# Patient Record
Sex: Female | Born: 1975 | Race: White | Hispanic: No | Marital: Married | Smoking: Never smoker
Health system: Southern US, Community
[De-identification: ages and names within clinical notes are randomized; demographics above are authoritative.]

## PROBLEM LIST (undated history)

## (undated) DIAGNOSIS — A6000 Herpesviral infection of urogenital system, unspecified: Secondary | ICD-10-CM

## (undated) DIAGNOSIS — IMO0002 Reserved for concepts with insufficient information to code with codable children: Secondary | ICD-10-CM

## (undated) DIAGNOSIS — R011 Cardiac murmur, unspecified: Secondary | ICD-10-CM

## (undated) DIAGNOSIS — Z8632 Personal history of gestational diabetes: Secondary | ICD-10-CM

## (undated) DIAGNOSIS — Z973 Presence of spectacles and contact lenses: Secondary | ICD-10-CM

## (undated) DIAGNOSIS — Z972 Presence of dental prosthetic device (complete) (partial): Secondary | ICD-10-CM

## (undated) HISTORY — DX: Herpesviral infection of urogenital system, unspecified: A60.00

## (undated) HISTORY — DX: Personal history of gestational diabetes: Z86.32

## (undated) HISTORY — PX: DILATION AND CURETTAGE OF UTERUS: SHX78

## (undated) HISTORY — DX: Reserved for concepts with insufficient information to code with codable children: IMO0002

---

## 1997-06-29 ENCOUNTER — Other Ambulatory Visit: Admission: RE | Admit: 1997-06-29 | Discharge: 1997-06-29 | Payer: Self-pay | Admitting: Internal Medicine

## 1999-09-25 ENCOUNTER — Other Ambulatory Visit: Admission: RE | Admit: 1999-09-25 | Discharge: 1999-09-25 | Payer: Self-pay | Admitting: Internal Medicine

## 2001-01-13 ENCOUNTER — Other Ambulatory Visit: Admission: RE | Admit: 2001-01-13 | Discharge: 2001-01-13 | Payer: Self-pay | Admitting: Gynecology

## 2002-03-02 DIAGNOSIS — Z8632 Personal history of gestational diabetes: Secondary | ICD-10-CM

## 2002-03-02 HISTORY — DX: Personal history of gestational diabetes: Z86.32

## 2002-09-06 ENCOUNTER — Encounter: Payer: Self-pay | Admitting: Gynecology

## 2002-09-06 ENCOUNTER — Ambulatory Visit (HOSPITAL_COMMUNITY): Admission: RE | Admit: 2002-09-06 | Discharge: 2002-09-06 | Payer: Self-pay | Admitting: Gynecology

## 2002-11-15 ENCOUNTER — Other Ambulatory Visit: Admission: RE | Admit: 2002-11-15 | Discharge: 2002-11-15 | Payer: Self-pay | Admitting: Gynecology

## 2003-04-03 ENCOUNTER — Encounter: Admission: RE | Admit: 2003-04-03 | Discharge: 2003-04-03 | Payer: Self-pay | Admitting: Gynecology

## 2003-05-05 ENCOUNTER — Inpatient Hospital Stay (HOSPITAL_COMMUNITY): Admission: AD | Admit: 2003-05-05 | Discharge: 2003-05-09 | Payer: Self-pay | Admitting: Gynecology

## 2003-05-22 ENCOUNTER — Inpatient Hospital Stay (HOSPITAL_COMMUNITY): Admission: AD | Admit: 2003-05-22 | Discharge: 2003-05-24 | Payer: Self-pay | Admitting: Gynecology

## 2003-05-22 ENCOUNTER — Encounter (INDEPENDENT_AMBULATORY_CARE_PROVIDER_SITE_OTHER): Payer: Self-pay | Admitting: Specialist

## 2003-07-17 ENCOUNTER — Other Ambulatory Visit: Admission: RE | Admit: 2003-07-17 | Discharge: 2003-07-17 | Payer: Self-pay | Admitting: Gynecology

## 2004-11-23 IMAGING — US US OB COMP +14 WK
1 series · 18 of 28 positions shown · non-contrast
Comparison: none

CLINICAL DATA: Cramping.  Please check cervical length.
 OBSTETRICAL ULTRASOUND
 Number of Fetuses: 1
 Heart Rate: 176 bpm
 Movement: Yes
 Breathing:  Yes  
 Presentation: Cephalic
 Placental Location: Fundal
 Grade: II
 Previa: No
 Amniotic Fluid (Subjective): Normal
 Amniotic Fluid (Objective):   15.3 cm AFI (6th-36th%ile = 8.1 to 24.8 cm for 34 weeks)

[Series 1: us ob comp +14 wk · 18 of 36 slices shown]
[im 1/36]
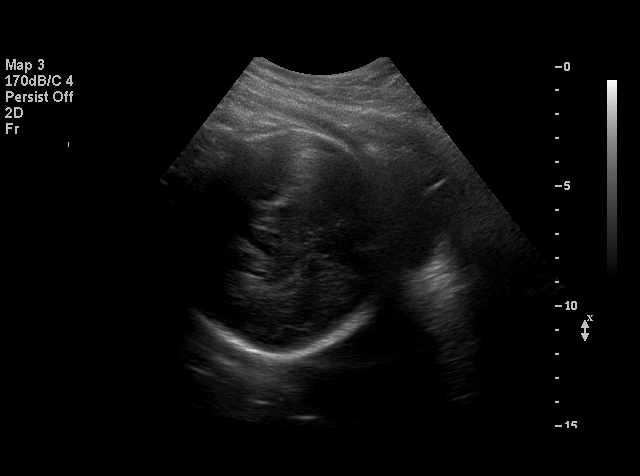
[im 3/36]
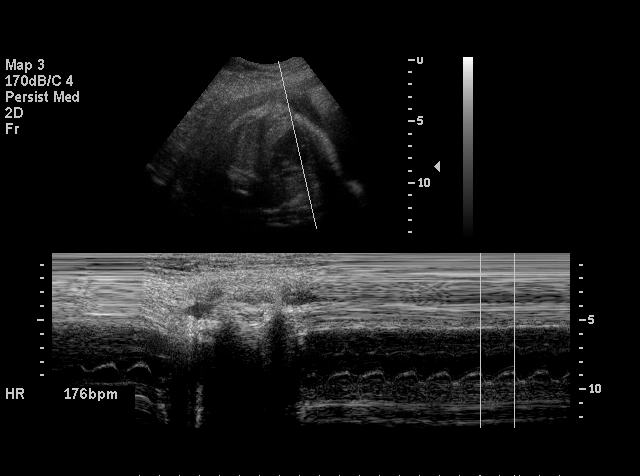
[im 4/36]
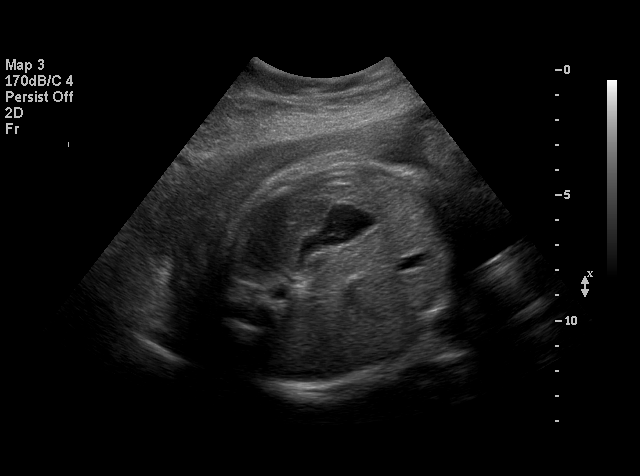
[im 7/36]
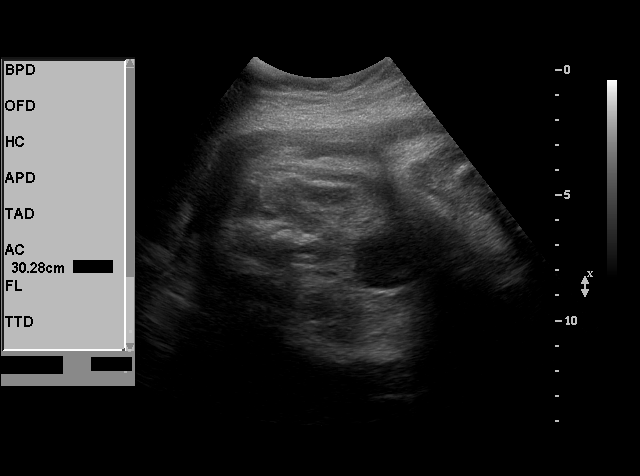
[im 10/36]
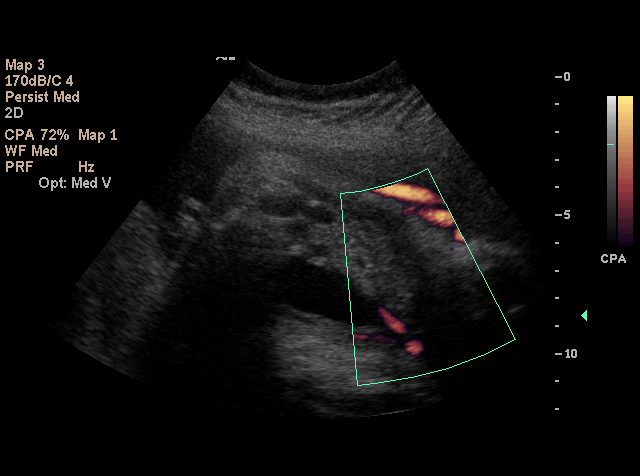
[im 11/36]
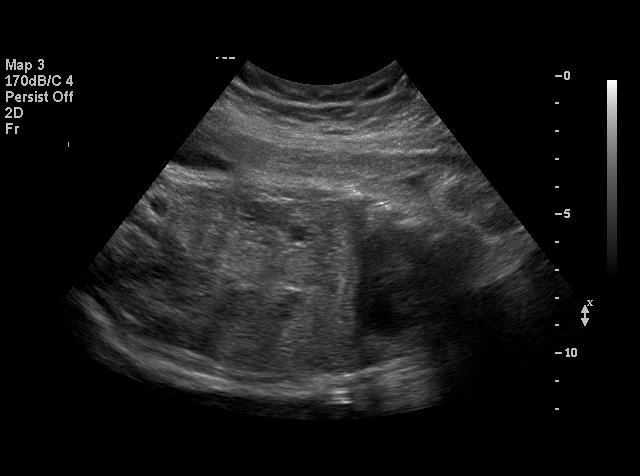
[im 13/36]
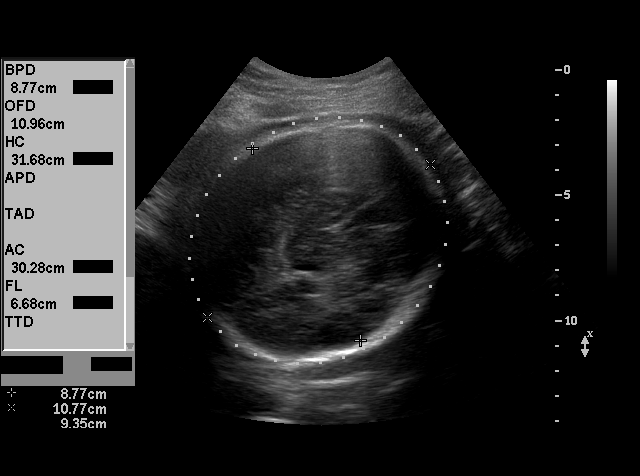
[im 15/36]
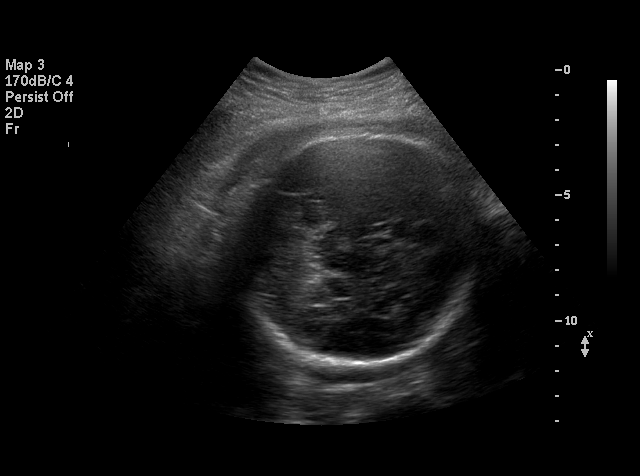
[im 17/36]
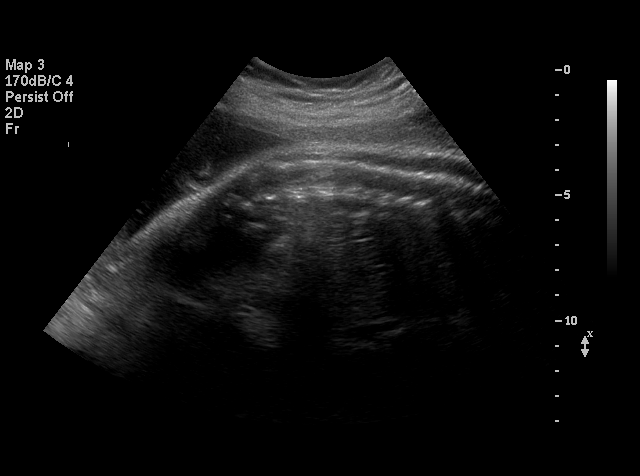
[im 19/36]
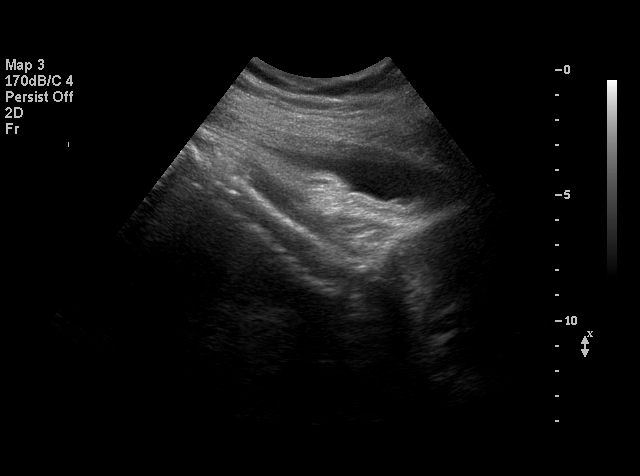
[im 21/36]
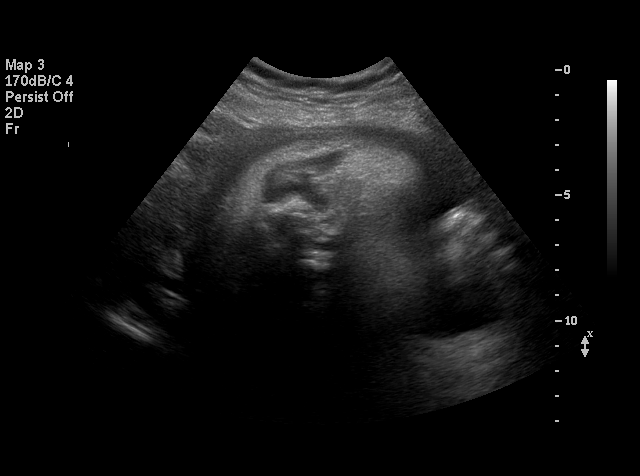
[im 23/36]
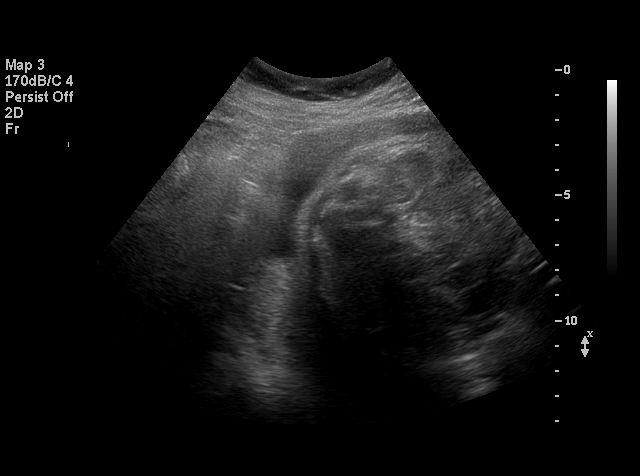
[im 25/36]
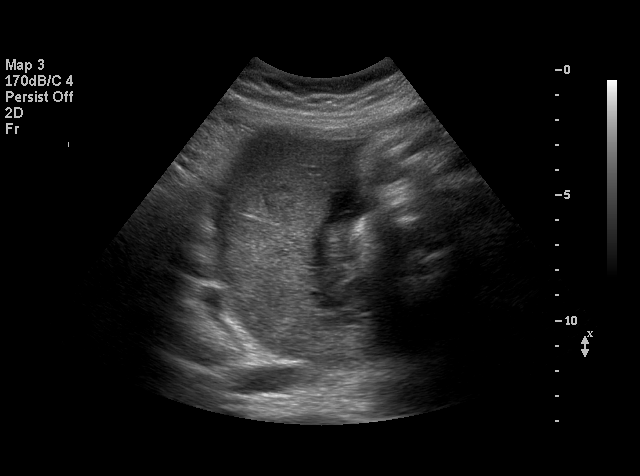
[im 28/36]
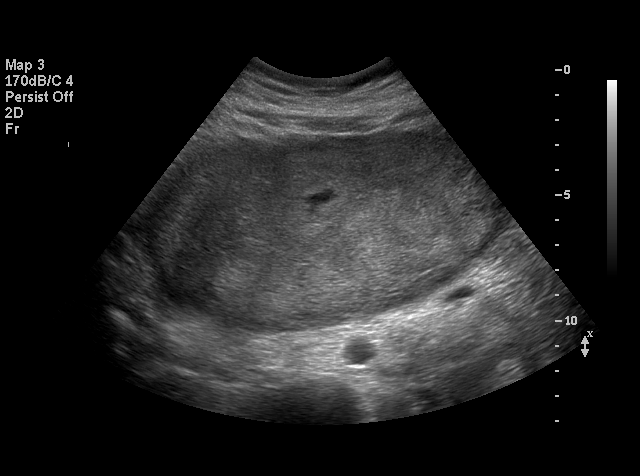
[im 29/36]
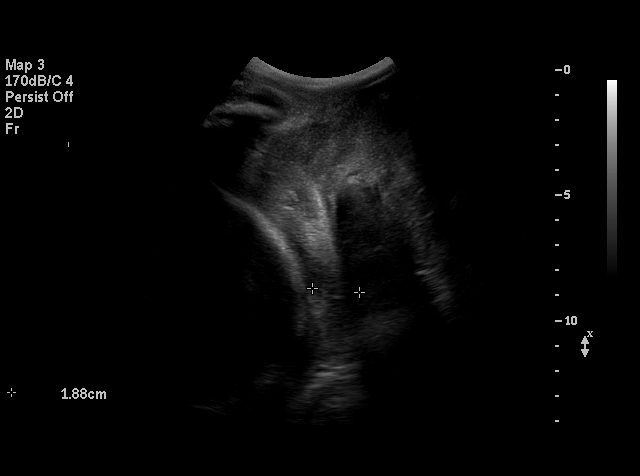
[im 32/36]
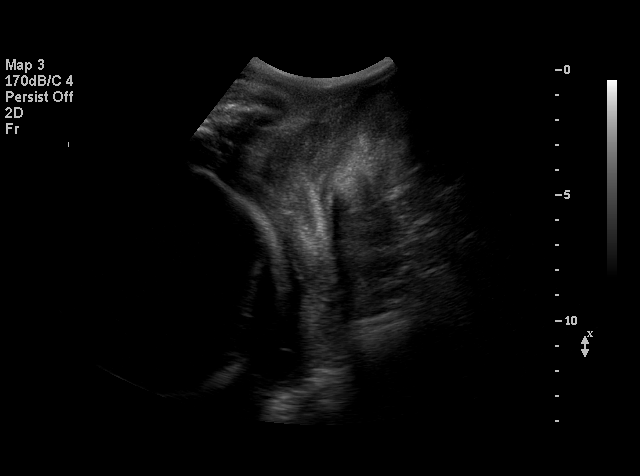
[im 33/36]
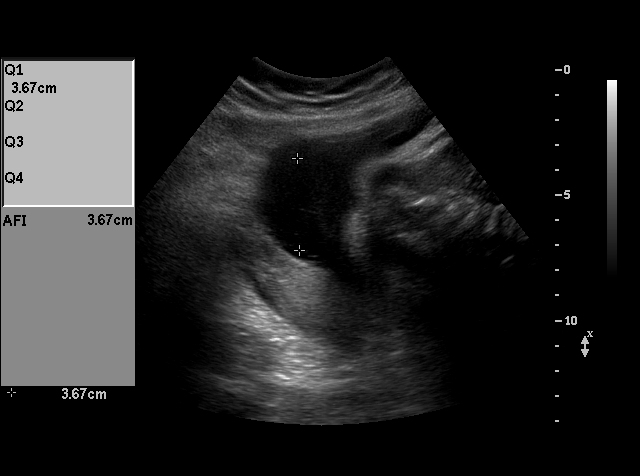
[im 36/36]
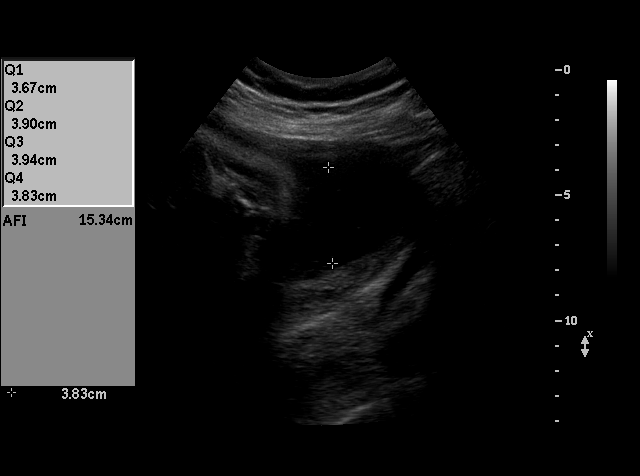

[18 of 28 positions shown; findings below may reference images not displayed]

FETAL BIOMETRY
 BPD:  9.0 cm, 36W 4D
 HC:  31.9 cm, 36W 0D   
 AC:  29.9 cm, 34W 0D  
 FL: 6.7 cm, 34W 4D

 MEAN GA: 35W 2D

 EFW: 4297 g (H) 04th-81th %ile (1996-3677) for 34 weeks 

 FETAL ANATOMY
 Lateral Ventricles: Visualized   
 Thalami/CSP: Visualized     
 Posterior Fossa:  Not visualized   
 Nuchal Region: N/A  
 Spine: Visualized     
 4 Chamber Heart on Left:   Visualized   
 Stomach on Left:   Visualized     
 3 Vessel Cord: Visualized   
 Cord Insertion site: Not visualized   
 Kidneys: Visualized   
 Bladder:   Visualized   
 Extremities: Not visualized     

 ADDITIONAL ANATOMY VISUALIZED: diaphragm.

 Evaluation limited by: fetal position and advanced gestational age.

 MATERNAL FINDINGS
 Cervix:   3.3 cm translabial.
IMPRESSION: Single living intrauterine gestation currently in a cephalic presentation with no evidence for placenta previa and Normal amniotic fluid volume.
 Gestational age by ultrasound concurs with assigned gestational age and estimated fetal weight from the 75th to 90th percentile.
 Cervical length 3.3 cm obtained translabially.
 Limited anatomical evaluation as noted above.

## 2004-11-26 IMAGING — US US OB LIMITED
1 series · 1 of 1 positions shown · non-contrast
Comparison: none

CLINICAL DATA: Assess cervical length.

[Series 1: unknown · 0.37mm/px · 1 of 1 slices shown]
[im 1/1]
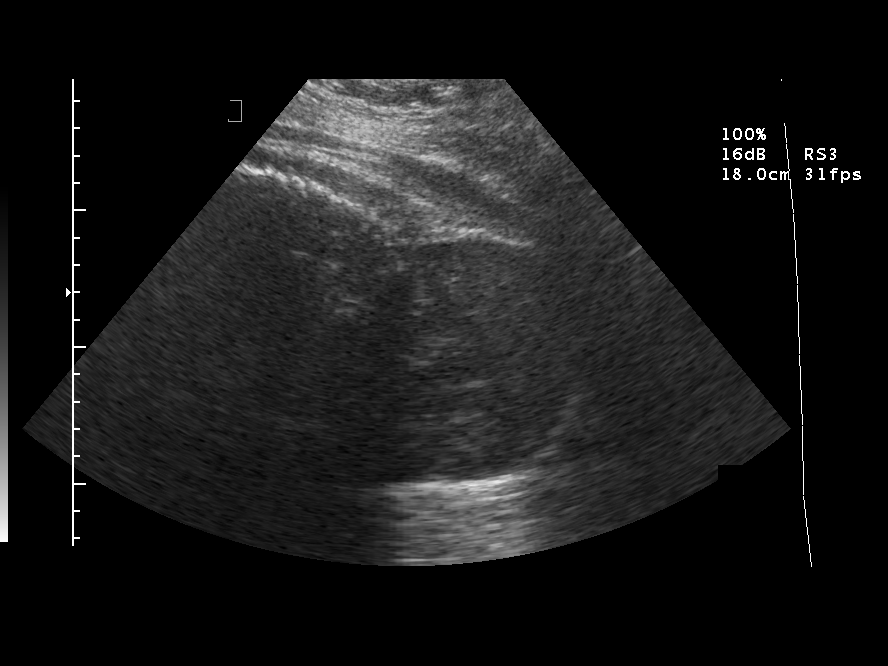

[1 of 1 positions shown; findings below may reference images not displayed]

LIMITED OBSTETRICAL ULTRASOUND WITH TRANSVAGINAL
Number of Fetuses:  1
Heart Rate:  150
Movement:  Yes
Breathing:  Yes
Presentation:  Cephalic
Placental Location:  Fundal
Grade:  I
Previa:  No
Amniotic Fluid (Subjective):  Normal
Amniotic Fluid (Objective):  17.9 cm AFI (5th - 95th%ile =   7.7 ? 24.9 cm for 35 wks)

Fetal measurements and complete anatomic evaluation were not requested.  The following fetal anatomy was visualized during this exam:  Lateral ventricles, stomach, kidneys, bladder and diaphragm.

MATERNAL FINDINGS
Cervix: 1.6 cm Transvaginally
IMPRESSION: Single living intrauterine fetus in cephalic presentation.    Cervical length is shortened to 1.6 cm on transvaginal scanning.  
Normal amniotic fluid volume with AFI 17.9 cm.

## 2005-02-18 ENCOUNTER — Other Ambulatory Visit: Admission: RE | Admit: 2005-02-18 | Discharge: 2005-02-18 | Payer: Self-pay | Admitting: Gynecology

## 2005-02-19 ENCOUNTER — Ambulatory Visit (HOSPITAL_COMMUNITY): Admission: RE | Admit: 2005-02-19 | Discharge: 2005-02-19 | Payer: Self-pay | Admitting: Gynecology

## 2005-02-19 ENCOUNTER — Encounter (INDEPENDENT_AMBULATORY_CARE_PROVIDER_SITE_OTHER): Payer: Self-pay | Admitting: Specialist

## 2005-08-05 ENCOUNTER — Other Ambulatory Visit: Admission: RE | Admit: 2005-08-05 | Discharge: 2005-08-05 | Payer: Self-pay | Admitting: Gynecology

## 2006-02-08 ENCOUNTER — Inpatient Hospital Stay (HOSPITAL_COMMUNITY): Admission: AD | Admit: 2006-02-08 | Discharge: 2006-02-11 | Payer: Self-pay | Admitting: Gynecology

## 2006-03-10 ENCOUNTER — Other Ambulatory Visit: Admission: RE | Admit: 2006-03-10 | Discharge: 2006-03-10 | Payer: Self-pay | Admitting: Gynecology

## 2006-12-16 ENCOUNTER — Other Ambulatory Visit: Admission: RE | Admit: 2006-12-16 | Discharge: 2006-12-16 | Payer: Self-pay | Admitting: Gynecology

## 2007-12-27 ENCOUNTER — Ambulatory Visit: Payer: Self-pay | Admitting: Gynecology

## 2007-12-27 ENCOUNTER — Other Ambulatory Visit: Admission: RE | Admit: 2007-12-27 | Discharge: 2007-12-27 | Payer: Self-pay | Admitting: Gynecology

## 2007-12-27 ENCOUNTER — Encounter: Payer: Self-pay | Admitting: Gynecology

## 2008-07-04 ENCOUNTER — Other Ambulatory Visit: Admission: RE | Admit: 2008-07-04 | Discharge: 2008-07-04 | Payer: Self-pay | Admitting: Gynecology

## 2008-07-04 ENCOUNTER — Ambulatory Visit: Payer: Self-pay | Admitting: Gynecology

## 2008-07-04 ENCOUNTER — Encounter: Payer: Self-pay | Admitting: Gynecology

## 2009-06-03 ENCOUNTER — Ambulatory Visit: Payer: Self-pay | Admitting: Gynecology

## 2009-06-03 ENCOUNTER — Other Ambulatory Visit: Admission: RE | Admit: 2009-06-03 | Discharge: 2009-06-03 | Payer: Self-pay | Admitting: Gynecology

## 2010-07-18 NOTE — Op Note (Signed)
Kathleen West, Kathleen West               ACCOUNT NO.:  [id number]   MEDICAL RECORD NO.:  [id number]          PATIENT TYPE:  AMB   LOCATION:  SDC                           [facility]ACILITY:  [facility]   PHYSICIAN:  Ivor Costa. Farrel Gobble, M.D. DATE OF BIRTH:  [birth year]   DATE OF PROCEDURE:  02/19/2005  DATE OF DISCHARGE:                                 OPERATIVE REPORT   PREOPERATIVE DIAGNOSIS:  Missed abortion at nine weeks.   POSTOPERATIVE DIAGNOSIS:  Missed abortion at nine weeks.   OPERATION/PROCEDURE:  First trimester dilation and evacuation.   SURGEON:  Ivor Costa. Farrel Gobble, M.D.   ANESTHESIA:  MAC with paracervical block.   ESTIMATED BLOOD LOSS:  Minimal.   FINDINGS:  The patient uterus is anteverted, mobile with smooth contours.   PATHOLOGY:  Uterine contents.   DESCRIPTION OF PROCEDURE:  The patient was taken to the operating room.  MAC  anesthesia was induced.  She was placed in the dorsal lithotomy position.  Prepped and draped in the usual sterile fashion.  Bivalve speculum was  placed in the vagina and cervix was visualized and stabilized with a single-  tooth tenaculum and paracervical block of equal parts 2% lidocaine and 0.25%  Marcaine was placed circumferentially around the cervix.  The cervix was  then gently dilated up to 31-French after which a #9 disposable trocar was  inserted through the cervix.  The opening pressure was obtained and the  cavity was cleared of its contents.  A second pass failed to reveal any  tissue. A gentle curetting showed normal crie, smooth contours and no  contents left in the cavity.  The patient tolerated the procedure well.  She  received Methergine intraoperatively and transferred to the PACU in stable  condition.      Ivor Costa. Farrel Gobble, M.D.  Electronically Signed     THL/MEDQ  D:  02/19/2005  T:  02/20/2005  Job:  [id number]

## 2010-07-18 NOTE — H&P (Signed)
NAMEGAGANDEEP, Kathleen West               ACCOUNT NO.:  [id number]   MEDICAL RECORD NO.:  [id number]          PATIENT TYPE:  INP   LOCATION:  9164                          [facility]ILITY:  [facility]   PHYSICIAN:  Timothy P. Fontaine, M.D.DATE OF BIRTH:  01-27-1976   DATE OF ADMISSION:  02/08/2006  DATE OF DISCHARGE:                              HISTORY & PHYSICAL   skip>   CHIEF COMPLAINT:  Labor.   HISTORY OF PRESENT ILLNESS:  A 35 year old G49, P32 female at [redacted] weeks  gestation who enters contracting every 5 minutes.  Admission exam in  triage was approximately 5 cm with a reactive fetal tracing and she was  admitted for labor and delivery.  The patient's past obstetrical history  is significant for preterm delivery at 36 weeks.  The patient's  pregnancy has otherwise been uncomplicated.  For remainder of her  history, see her [place].  She is beta strep negative carrier.  She  has been monitoring her glucose at home due to a past history of  diabetes and these have been normal.  She has had normal antepartum  testing to include nonstress tests.   PHYSICAL EXAMINATION TODAY:  HEENT: Normal.  LUNGS: Clear.  CARDIAC:  Regular rate.  No rubs, murmurs or gallops.  ABDOMEN:  Gravid, vertex fetus consistent with dates.  EXTERNAL MONITORS:  Reactive fetal tracing, contractions every 2-5  minutes.  PELVIC:  Exam is 90%, 5 cm, 0 station, bulging membranes.  Artificial  rupture of membranes, fluid noted to be clear.   ASSESSMENT AND PLAN:  A 35 year old gravida 3, para 1 female, 37 weeks,  labor, membranes ruptured, fluid clear.  Progress with labor and  delivery.  Beta strep is negative.      Timothy P. Fontaine, M.D.  Electronically Signed     TPF/MEDQ  D:  02/09/2006  T:  02/09/2006  Job:  [id number]

## 2010-07-18 NOTE — H&P (Signed)
NAME:  Kathleen West, Kathleen West                         ACCOUNT NO.:  [id number]   MEDICAL RECORD NO.:  [id number]                   PATIENT TYPE:  INP   LOCATION:  9153                                 [facility]ILITY:  [facility]   PHYSICIAN:  Juan H. Lily Peer, M.D.             DATE OF BIRTH:  [birth year]   DATE OF ADMISSION:  05/05/2003  DATE OF DISCHARGE:                                HISTORY & PHYSICAL   CHIEF COMPLAINT:  Abdominal cramping.   HISTORY OF PRESENT ILLNESS:  Kathleen West is a 35 year old gravida 1, para 0,  who conceived via Clomid.  Based on early ultrasound estimated date of  confinement has been calculated to be April 15 and she is currently 70  weeks' gestation.  The patient had phoned earlier and presented to [facility] as per instruction due to the fact that she had been experiencing  vague abdominal cramping that was steady in nature which started today.  On  further questioning she stated for the past two weeks she has noticed an  increased cervical mucus and discharge, but no blood-like material noted,  and the cramping has been on and off for the past two weeks, but brought her  attention that it was more of a constant nature today.  She was examined and  found to be 2-3 cm dilated, about 80% effaced, intact, and -3 station.  She  was placed on the monitor and initially appeared to be uterine irritability  and during the time of the physical examination she was noted to have  contractions, mild, every 2-3 minutes apart, but an overall reassuring fetal  heart rate tracing.  She was afebrile with temperature 98.2 and blood  pressure of 127/71.  She was sent for ultrasound for a cervical lymph  measurement and a full ultrasound scan.  Her cervix measured 3.3 cm and  estimated fetal weight was in the 75th to 90th percentile for 34 weeks'  gestation.  Vertex presentation AFI was normal and the uterus was a grade 2  in fundal location.  Estimated fetal weight was recorded 2416  to 2687 g.   PHYSICAL EXAMINATION:  VITAL SIGNS:  Her temperature was 98.2, blood  pressure 127/71.  HEENT:  Unremarkable.  NECK:  Supple.  Trachea midline.  No current bruits.  No thyromegaly.  LUNGS:  Clear to auscultation without any rhonchi or wheezes.  HEART:  Regular rate and rhythm.  No murmurs or gallops.  BREASTS:  Exam not done.  ABDOMEN:  Gravid uterus.  Vertex presentation by [facility] maneuver.  PELVIC:  Exam as described above.  EXTREMITIES:  Trace edema.  DTR 1+, __________clonus.   LABORATORY DATA:  Serum electrolytes were normal.  Glucose 84.  Patient with  gestational diabetes on insulin consisting of four units of NPH at bedtime.  She said that her fasting blood sugar was less than 90 this mon ring before  coming to the hospital.  Her CBC demonstrated a white blood count of 11.4,  hemoglobin and hematocrit of 11.6 and 34.7 respectively, with a platelet  count of 260,000.  Her urinalysis had a specific gravity of 1.010 and pH of  7.5 and there was large amount of leukocyte esterase and trace hemoglobin.  Her wet prep demonstrated yeast and moderate amount of wbc's and bacteria  too numerous to count.  GC and Chlamydia culture were obtained as well as  group B Strep pending at time of this dictation.   ASSESSMENT:  A 35 year old gravida 1, para 0, 34 weeks estimated gestational  age, with premature cervical dilatation in apparent preterm labor.  Will  proceed with a fetal fibronectin tomorrow after 24 hours.  This pelvic exam  was just recently done.  GC and Chlamydia culture pending at time of this  dictation.  She will be admitted and first lactated with lactated ringers  500 bolus followed by 125 cc per hour, and if this does not break the  pattern of her premature contraction, will initiate  tocolysis such as with  magnesium sulfate.  Since the GBS status is unknown to me, bacteria seen on  wet prep, will go ahead and start with Pen G 5 milliunits IV now followed  by  2.5 milliunits IV q.4h. and continue with monitoring and bed rest.  Will  continue to monitor her blood sugars and fasting blood sugars and two hour  postprandial and continue her NPH insulin consisting of four units of NPH  q.h.s.  All the above findings and management discussed with the patient and  husband who was present.   PLAN:  As per assessment above.                                               Juan H. Lily Peer, M.D.    JHF/MEDQ  D:  05/05/2003  T:  05/05/2003  Job:  [id number]

## 2010-07-18 NOTE — Discharge Summary (Signed)
NAME:  Kathleen West, Kathleen West                         ACCOUNT NO.:  [id number]   MEDICAL RECORD NO.:  [id number]                   PATIENT TYPE:  INP   LOCATION:  9153                                 [facility]   PHYSICIAN:  Ivor Costa. Farrel Gobble, M.D.              DATE OF BIRTH:  11-14-1975   DATE OF ADMISSION:  05/05/2003  DATE OF DISCHARGE:  05/09/2003                                 DISCHARGE SUMMARY   DISCHARGE DIAGNOSIS:  1. Intrauterine pregnancy at 34 weeks, undelivered.  2. Preterm labor.  3. Gestational diabetes, insulin dependent.   HISTORY:  A 27-years-of-age female gravida 1 para 0 with EDC of June 15, 2003.  Prenatal course had been uncomplicated to admission except for  gestational diabetes which was insulin dependent.   HOSPITAL COURSE:  On May 05, 2003 the patient presented at 34 weeks with  abdominal cramping.  She had noticed increased amount of vaginal discharge 2  weeks prior.  Examined and found to be 2-3 cm, 80%, -3.  The patient was  monitored, found to be contracting, and was diagnosed with preterm labor.  Was begun on IV Penicillin G.  GC and chlamydia were obtained as well as wet  prep.  All returned within normal limits and the patient was placed on  bedrest.  She was continued on NPH insulin.  On May 06, 2003 the patient  had been given betamethasone and was doing well.  She was on magnesium  sulfate.  On May 07, 2003 the patient was weaned off of magnesium sulfate  and started on Procardia 10 mg p.o. q.6h.  On May 08, 2003 no regular  contractions.  A.m. sonogram for cervical length of 1.6 and cervical exam  was 2-3 and 80% which is stable.  Group B strep had returned negative and on  May 08, 2003 the patient had increased cramping and nifedipine had been  increased to 20 mg q.6h. which decreased the symptoms.  Cervix, however,  remained stable without vaginal changes and on May 09, 2003 the patient was  without complaint, felt only occasional  contraction, tolerating Procardia  well.  She was 2-3, 90%, -3, and posterior.  Nonstress test was reactive  without decelerations, contractions approximately every 9 minutes.  At that  point the patient was assessed with no vaginal exam changes despite  contractions and thought was stable for discharge to home.   ACCESSORY CLINICAL FINDINGS/LABORATORY DATA:  The patient is group B strep  negative.  GC and chlamydia returned negative on May 05, 2003.  Hemoglobin  on May 05, 2003 was 11.6.   DISPOSITION:  The patient discharged to home.  She was to return biweekly  for office visits and cervical checks, was given labor precautions with  continued Procardia 20 mg q.i.d. until 37 weeks, and was placed on bedrest.     Susa Loffler, P.A.  Ivor Costa. Farrel Gobble, M.D.    TSG/MEDQ  D:  06/11/2003  T:  06/11/2003  Job:  [id number]

## 2010-07-18 NOTE — Discharge Summary (Signed)
NAME:  Kathleen West, Kathleen West                         ACCOUNT NO.:  [id number]   MEDICAL RECORD NO.:  [id number]                   PATIENT TYPE:  INP   LOCATION:  9133                                 [facility]   PHYSICIAN:  Ivor Costa. Farrel Gobble, M.D.              DATE OF BIRTH:  March 14, 1975   DATE OF ADMISSION:  05/22/2003  DATE OF DISCHARGE:  05/24/2003                                 DISCHARGE SUMMARY   DISCHARGE DIAGNOSES:  1. Intrauterine pregnancy at 36+ weeks delivered.  2. History of preterm labor.  3. Gestational diabetes, insulin dependent.  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  5. Status post spontaneous vaginal delivery.   HISTORY:  This is a 35 year old female gravida 1 para 0 with an EDC of June 15, 2003.  Prenatal course had been complicated by the patient had a history  of ocular HSV.  She also had a questionable history of chronic hypertension  and developed gestational diabetes which was insulin dependent, developed  preterm labor in pregnancy, and actually 2 weeks prior to admission had been  tocolyzed and given steroids and discontinued with Procardia.   HOSPITAL COURSE:  On May 22, 2003 the patient presented at 36 weeks with  contractions every 5 minutes.  Cervix was 4, 90%, and 0 station.  Options  were discussed with the patient.  The patient desired artificial rupture of  membranes and therefore subsequently underwent the same and revealed clear  fluid.  The patient was begun on IV penicillin for group B strep protocol  and subsequently on May 22, 2003 the patient underwent a spontaneous  vaginal delivery of a female, Apgars of 8 and 9, weight of 7 pounds 1 ounce.  There was no episiotomy.  There was a second degree midline vaginal  laceration which was repaired.  There was a right left labia minora inner  and periurethral tear which was repaired.  There were no complications.  Postpartum the patient remained afebrile, voiding, in stable condition and  she  was discharged to home on May 24, 2003 and given [facility] Gynecology  postpartum instructions and postpartum booklet.   ACCESSORY CLINICAL FINDINGS/LABORATORY DATA:  The patient is O positive,  rubella immune.  On May 23, 2003 hemoglobin 11.2.   DISPOSITION:  The patient was discharged to home, informed to return to the  office in 6 weeks, if had any problem prior to that time to be seen in the  office.  She is to check blood sugars prior to 6 weeks postpartum check.     Susa Loffler, P.A.                    Ivor Costa. Farrel Gobble, M.D.    TSG/MEDQ  D:  06/11/2003  T:  06/11/2003  Job:  [id number]

## 2010-10-17 ENCOUNTER — Encounter: Payer: Self-pay | Admitting: *Deleted

## 2010-10-17 DIAGNOSIS — Z8632 Personal history of gestational diabetes: Secondary | ICD-10-CM | POA: Insufficient documentation

## 2010-10-20 ENCOUNTER — Other Ambulatory Visit (HOSPITAL_COMMUNITY)
Admission: RE | Admit: 2010-10-20 | Discharge: 2010-10-20 | Disposition: A | Discharge status: Home / Self Care | Payer: 59 | Source: Ambulatory Visit | Attending: Gynecology | Admitting: Gynecology

## 2010-10-20 ENCOUNTER — Encounter: Payer: Self-pay | Admitting: Gynecology

## 2010-10-20 ENCOUNTER — Ambulatory Visit (INDEPENDENT_AMBULATORY_CARE_PROVIDER_SITE_OTHER): Payer: 59 | Admitting: Gynecology

## 2010-10-20 VITALS — BP 116/74 | Ht 66.0 in | Wt 178.0 lb

## 2010-10-20 DIAGNOSIS — Z01419 Encounter for gynecological examination (general) (routine) without abnormal findings: Secondary | ICD-10-CM

## 2010-10-20 DIAGNOSIS — Z131 Encounter for screening for diabetes mellitus: Secondary | ICD-10-CM

## 2010-10-20 DIAGNOSIS — R82998 Other abnormal findings in urine: Secondary | ICD-10-CM

## 2010-10-20 DIAGNOSIS — Z8632 Personal history of gestational diabetes: Secondary | ICD-10-CM

## 2010-10-20 DIAGNOSIS — Z13220 Encounter for screening for lipoid disorders: Secondary | ICD-10-CM

## 2010-10-20 NOTE — Progress Notes (Signed)
Kathleen West [birth year] [id number]        35 y.o.  for annual exam.  Mirena IUD contraception due to be replaced this coming January.  Past medical history,surgical history, allergies, family history and social history were all reviewed and documented in the EPIC chart. ROS:  Was performed and pertinent positives and negatives are included in the history.  Exam: chaperone present Filed Vitals:   10/20/10 1155  BP: 116/74   General appearance  Normal Skin grossly normal Head/Neck normal with no cervical or supraclavicular adenopathy thyroid normal Lungs  clear Cardiac RR, without RMG Abdominal  soft, nontender, without masses, organomegaly or hernia Breasts  examined lying and sitting without masses, retractions, discharge or axillary adenopathy. Pelvic  Ext/BUS/vagina  normal   Cervix  normal  Pap done IUD string visualized  Uterus  anteverted, normal size, shape and contour, midline and mobile nontender   Adnexa  Without masses or tenderness    Anus and perineum  normal   Rectovaginal  normal sphincter tone without palpated masses or tenderness.    Assessment/Plan:  35 y.o. female for annual exam.   Doing well. Reminded her to schedule for IUD replacement this coming December or January she knows to do so. Self breast exams on the basis discussed urged. Screening mammogram recommendations  between 35 and 40 were reviewed, patient has no strong family history prefers to wait closer to 40.  Will check baseline CBC glucose lipid profile and urinalysis.    Dara Lords MD, 12:27 PM 10/20/2010

## 2011-03-05 ENCOUNTER — Telehealth: Payer: Self-pay | Admitting: *Deleted

## 2011-03-05 ENCOUNTER — Other Ambulatory Visit: Payer: Self-pay | Admitting: *Deleted

## 2011-03-05 DIAGNOSIS — Z3049 Encounter for surveillance of other contraceptives: Secondary | ICD-10-CM

## 2011-03-05 MED ORDER — LEVONORGESTREL 20 MCG/24HR IU IUD: INTRAUTERINE_SYSTEM | Freq: Once | INTRAUTERINE | Status: DC

## 2011-03-05 NOTE — Telephone Encounter (Signed)
Patient informed Mirena IUD benefits are covered at 100%.  Has upcoming appt to remove and insert.

## 2011-03-20 ENCOUNTER — Ambulatory Visit: Payer: 59 | Admitting: Gynecology

## 2011-03-27 ENCOUNTER — Ambulatory Visit: Payer: 59 | Admitting: Gynecology

## 2011-05-22 ENCOUNTER — Encounter: Payer: Self-pay | Admitting: Gynecology

## 2011-05-22 ENCOUNTER — Ambulatory Visit (INDEPENDENT_AMBULATORY_CARE_PROVIDER_SITE_OTHER): Payer: 59 | Admitting: Gynecology

## 2011-05-22 DIAGNOSIS — Z30433 Encounter for removal and reinsertion of intrauterine contraceptive device: Secondary | ICD-10-CM

## 2011-05-22 NOTE — Progress Notes (Signed)
Patient presents to have her Mirena IUD replaced. She's doing well and wants to continue with this is at her 5 year mark. I reviewed the removal and reinsertion process with her. She is ready to her a booklet and signed our consent form. Reviewed the risks to include infection, perforation and migration requiring surgery to remove, failure with pregnancy all of which she understands and accepts.  Exam was Sherrilyn Rist chaperone present External BUS vagina normal. Cervix normal with IUD string visualized. Uterus anteverted normal size midline mobile nontender. Adnexa without masses or tenderness  Procedure Cervix was visualized, IUD string grasped and the Mirena IUD was removed shown to the patient and discarded. The cervix was cleansed with Betadine, single-tooth tenaculum anterior lip stabilization, the uterus was sounded and a new Mirena was placed according to manufacturer's recommendations. The strings were trimmed. The patient tolerated well and will follow up in one month for a postinsertional check.

## 2011-05-22 NOTE — Patient Instructions (Signed)
Follow up in one month for postinsertional check

## 2011-05-25 ENCOUNTER — Other Ambulatory Visit: Payer: Self-pay | Admitting: Gynecology

## 2011-05-25 DIAGNOSIS — Z3049 Encounter for surveillance of other contraceptives: Secondary | ICD-10-CM

## 2011-10-19 ENCOUNTER — Ambulatory Visit (INDEPENDENT_AMBULATORY_CARE_PROVIDER_SITE_OTHER): Payer: 59 | Admitting: Gynecology

## 2011-10-19 ENCOUNTER — Encounter: Payer: Self-pay | Admitting: Gynecology

## 2011-10-19 DIAGNOSIS — T839XXA Unspecified complication of genitourinary prosthetic device, implant and graft, initial encounter: Secondary | ICD-10-CM

## 2011-10-19 DIAGNOSIS — N926 Irregular menstruation, unspecified: Secondary | ICD-10-CM

## 2011-10-19 LAB — TSH: TSH: 2.069 u[IU]/mL (ref 0.350–4.500)

## 2011-10-19 MED ORDER — NORETHIN ACE-ETH ESTRAD-FE 1-20 MG-MCG PO TABS: 1.0000 | ORAL_TABLET | Freq: Every day | ORAL | Status: DC

## 2011-10-19 NOTE — Patient Instructions (Signed)
Start on oral contraceptives as we discussed. Follow for your annual exam in several months. Sooner if any issues.

## 2011-10-19 NOTE — Progress Notes (Signed)
Patient presents to discuss symptoms she's having with IUD. She had her Mirena IUD replaced in March and was doing well before this. She now is having light bleeding every month which she did not have before and notes some extra o hair growthn her face and some mild acne.  Exam was can assistant Abdomen soft nontender without masses guarding rebound organomegaly. Pelvic external BUS vagina normal. Cervix normal IUD string visualized an appropriate length. Uterus anteverted normal size midline mobile nontender. Adnexa without masses or tenderness.  Assessment and plan: Mild IUD side effects. Options for management were reviewed. Patient initially wanted her IUD pulled but on further discussion we both agree on starting a low-dose oral contraceptive for 3-6 months then stopping it and see how she does with the IUD in place. She does not smoke and is not being followed for any other medical issues. If she does well then she will continue with the IUD. If not then we'll ultimately remove it and continue on the low-dose oral contraceptives. Stroke heart attack DVT reviewed. She is due for her annual exam now have and I asked her to schedule in several months and that'll let us see how she's doing at that point on the birth control pills. I did recommend a baseline TSH given her symptoms and to go ahead and have that drawn today.

## 2011-11-12 ENCOUNTER — Telehealth: Payer: Self-pay | Admitting: *Deleted

## 2011-11-12 NOTE — Telephone Encounter (Signed)
Pt informed with normal Tsh results.

## 2011-12-15 ENCOUNTER — Other Ambulatory Visit: Payer: Self-pay | Admitting: Women's Health

## 2012-03-21 ENCOUNTER — Encounter: Payer: 59 | Admitting: Gynecology

## 2012-03-25 ENCOUNTER — Encounter: Payer: Self-pay | Admitting: Gynecology

## 2012-03-25 ENCOUNTER — Ambulatory Visit (INDEPENDENT_AMBULATORY_CARE_PROVIDER_SITE_OTHER): Payer: 59 | Admitting: Gynecology

## 2012-03-25 VITALS — BP 120/76 | Ht 65.5 in | Wt 179.0 lb

## 2012-03-25 DIAGNOSIS — Z30432 Encounter for removal of intrauterine contraceptive device: Secondary | ICD-10-CM

## 2012-03-25 DIAGNOSIS — Z13220 Encounter for screening for lipoid disorders: Secondary | ICD-10-CM

## 2012-03-25 DIAGNOSIS — Z01419 Encounter for gynecological examination (general) (routine) without abnormal findings: Secondary | ICD-10-CM

## 2012-03-25 DIAGNOSIS — T839XXA Unspecified complication of genitourinary prosthetic device, implant and graft, initial encounter: Secondary | ICD-10-CM

## 2012-03-25 LAB — CBC WITH DIFFERENTIAL/PLATELET
Lymphocytes Relative: 28 % (ref 12–46)
Lymphs Abs: 2.5 10*3/uL (ref 0.7–4.0)
MCH: 29.3 pg (ref 26.0–34.0)
MCHC: 33.2 g/dL (ref 30.0–36.0)
MCV: 88.3 fL (ref 78.0–100.0)
Monocytes Relative: 10 % (ref 3–12)

## 2012-03-25 MED ORDER — NORETHIN ACE-ETH ESTRAD-FE 1-20 MG-MCG PO TABS: 1.0000 | ORAL_TABLET | Freq: Every day | ORAL | Status: DC

## 2012-03-25 NOTE — Progress Notes (Signed)
Kathleen West [birth year] [id number]        37 y.o.  [id number] for annual exam.  Still is having issues with acne and vulvar boils attributed to her IUD. She wants to go and have this removed.  She has seen dermatology and is doing some treatments through them but she is tired of dealing with this and just wants to have the IUD removed and plans on using oral contraceptives.  Past medical history,surgical history, medications, allergies, family history and social history were all reviewed and documented in the EPIC chart. ROS:  Was performed and pertinent positives and negatives are included in the history.  Exam: [name] assistant Filed Vitals:   03/25/12 1030  BP: 120/76  Height: 5' 5.5" (1.664 m)  Weight: 179 lb (81.194 kg)   General appearance  Normal Skin grossly normal Head/Neck normal with no cervical or supraclavicular adenopathy thyroid normal Lungs  clear Cardiac RR, without RMG Abdominal  soft, nontender, without masses, organomegaly or hernia Breasts  examined lying and sitting without masses, retractions, discharge or axillary adenopathy. Pelvic  Ext/BUS/vagina  normal   Cervix  normal IUD string visualized, grasped with a [place] forcep and her Mirena IUD was removed, shown to her and discarded  Uterus  anteverted, normal size, shape and contour, midline and mobile nontender   Adnexa  Without masses or tenderness    Anus and perineum  normal   Rectovaginal  normal sphincter tone without palpated masses or tenderness.    Assessment/Plan:  37 y.o. [id number] female for annual exam.   1. Mirena IUD complication. Patient having androgenic type side effects from the IUD which circumstantially was related to her replacement. She wants to go ahead and have her IUD removed and I went ahead and removed it today. We'll start on Loestrin 120 equivalent BCPs. I had prescribed these previously but she never started on the Zetia go ahead and do this now. Back up contraception first  pack. 2. Pap smear 10/2010.  No Pap smear done today. History of low-grade SIL 2008 with negative annual Pap smears since then. We'll plan every 3 year to 5 year screening. 3. Mammography. Reviewed screening mammographic recommendations between 35 and 40. She has a strong family history firstly closer to 40. SBE monthly reviewed. 4. Health maintenance. Baseline CBC comprehensive metabolic panel lipid profile and urinalysis ordered. Follow up one year, sooner as needed.    Dara Lords MD, 10:56 AM 03/25/2012

## 2012-03-25 NOTE — Patient Instructions (Signed)
Start on oral contraceptives as discussed. Use condom backup first pack. Follow up in one year for annual exam, sooner if any issues.

## 2012-03-25 NOTE — Addendum Note (Signed)
Addended by: Dayna Barker on: 03/25/2012 11:06 AM   Modules accepted: Orders

## 2012-03-26 LAB — COMPREHENSIVE METABOLIC PANEL
AST: 14 U/L (ref 0–37)
Chloride: 103 mEq/L (ref 96–112)
Glucose, Bld: 80 mg/dL (ref 70–99)
Sodium: 136 mEq/L (ref 135–145)
Total Bilirubin: 0.6 mg/dL (ref 0.3–1.2)
Total Protein: 6.6 g/dL (ref 6.0–8.3)

## 2012-03-26 LAB — URINALYSIS W MICROSCOPIC + REFLEX CULTURE
Bacteria, UA: NONE SEEN
Crystals: NONE SEEN
Glucose, UA: NEGATIVE mg/dL
Nitrite: NEGATIVE
Protein, ur: NEGATIVE mg/dL
Specific Gravity, Urine: 1.025 (ref 1.005–1.030)
pH: 6.5 (ref 5.0–8.0)

## 2012-03-26 LAB — LIPID PANEL
Cholesterol: 164 mg/dL (ref 0–200)
VLDL: 24 mg/dL (ref 0–40)

## 2012-04-16 ENCOUNTER — Other Ambulatory Visit: Payer: Self-pay

## 2013-01-05 ENCOUNTER — Other Ambulatory Visit: Payer: Self-pay

## 2013-09-08 ENCOUNTER — Encounter (INDEPENDENT_AMBULATORY_CARE_PROVIDER_SITE_OTHER): Payer: Self-pay | Admitting: General Surgery

## 2013-09-22 ENCOUNTER — Ambulatory Visit (INDEPENDENT_AMBULATORY_CARE_PROVIDER_SITE_OTHER): Payer: Self-pay | Admitting: General Surgery

## 2013-09-27 ENCOUNTER — Ambulatory Visit (INDEPENDENT_AMBULATORY_CARE_PROVIDER_SITE_OTHER): Payer: 59 | Admitting: General Surgery

## 2013-10-25 ENCOUNTER — Encounter (INDEPENDENT_AMBULATORY_CARE_PROVIDER_SITE_OTHER): Payer: Self-pay | Admitting: General Surgery

## 2013-10-25 ENCOUNTER — Ambulatory Visit (INDEPENDENT_AMBULATORY_CARE_PROVIDER_SITE_OTHER): Payer: 59 | Admitting: General Surgery

## 2013-10-25 VITALS — BP 126/80 | HR 77 | Temp 97.5°F | Ht 65.0 in | Wt 183.0 lb

## 2013-10-25 DIAGNOSIS — K602 Anal fissure, unspecified: Secondary | ICD-10-CM

## 2013-10-25 MED ORDER — AMBULATORY NON FORMULARY MEDICATION: Status: DC

## 2013-10-25 NOTE — Patient Instructions (Signed)
Anal Fissure, Adult An anal fissure is a small tear or crack in the skin around the anus. Bleeding from a fissure usually stops on its own within a few minutes. However, bleeding will often reoccur with each bowel movement until the crack heals.  CAUSES   Passing large, hard stools.  Frequent diarrheal stools.  Constipation.  Inflammatory bowel disease (Crohn's disease or ulcerative colitis).  Infections.  Anal sex. SYMPTOMS   Small amounts of blood seen on your stools, on toilet paper, or in the toilet after a bowel movement.  Rectal bleeding.  Painful bowel movements.  Itching or irritation around the anus. DIAGNOSIS Your caregiver will examine the anal area. An anal fissure can usually be seen with careful inspection. A rectal exam may be performed and a short tube (anoscope) may be used to examine the anal canal. TREATMENT   You may be instructed to take fiber supplements. These supplements can soften your stool to help make bowel movements easier.  Sitz baths may be recommended to help heal the tear. Do not use soap in the sitz baths.  A medicated cream or ointment may be prescribed to lessen discomfort. HOME CARE INSTRUCTIONS   Maintain a diet high in fruits, whole grains, and vegetables. Avoid constipating foods like bananas and dairy products.  Take sitz baths as directed by your caregiver.  Drink enough fluids to keep your urine clear or pale yellow.  Only take over-the-counter or prescription medicines for pain, discomfort, or fever as directed by your caregiver. Do not take aspirin as this may increase bleeding.  Do not use ointments containing numbing medications (anesthetics) or hydrocortisone. They could slow healing. SEEK MEDICAL CARE IF:   Your fissure is not completely healed within 3 days.  You have further bleeding.  You have a fever.  You have diarrhea mixed with blood.  You have pain.  Your problem is getting worse rather than  better. MAKE SURE YOU:   Understand these instructions.  Will watch your condition.  Will get help right away if you are not doing well or get worse. Document Released: 02/16/2005 Document Revised: 05/11/2011 Document Reviewed: 08/03/2010 [facility] Patient Information 2015 [place], [place]. This information is not intended to replace advice given to you by your health care provider. Make sure you discuss any questions you have with your health care provider.  GETTING TO GOOD BOWEL HEALTH. Irregular bowel habits such as constipation and diarrhea can lead to many problems over time.  Having one soft bowel movement a day is the most important way to prevent further problems.  The anorectal canal is designed to handle stretching and feces to safely manage our ability to get rid of solid waste (feces, poop, stool) out of our body.  BUT, hard constipated stools can act like ripping concrete bricks and diarrhea can be a burning fire to this very sensitive area of our body, causing inflamed hemorrhoids, anal fissures, increasing risk is perirectal abscesses, abdominal pain/bloating, an making irritable bowel worse.     The goal: ONE SOFT BOWEL MOVEMENT A DAY!  To have soft, regular bowel movements:    Drink at least 8 tall glasses of water a day.     Take plenty of fiber.  Fiber is the undigested part of plant food that passes into the colon, acting s "natures broom" to encourage bowel motility and movement.  Fiber can absorb and hold large amounts of water. This results in a larger, bulkier stool, which is soft and easier to pass.  Work gradually over several weeks up to 6 servings a day of fiber (25g a day even more if needed) in the form of: o Vegetables -- Root (potatoes, carrots, turnips), leafy green (lettuce, salad greens, celery, spinach), or cooked high residue (cabbage, broccoli, etc) o Fruit -- Fresh (unpeeled skin & pulp), Dried (prunes, apricots, cherries, etc ),  or stewed ( applesauce)  o Whole  grain breads, pasta, etc (whole wheat)  o Bran cereals    Bulking Agents -- This type of water-retaining fiber generally is easily obtained each day by one of the following:  o Psyllium bran -- The psyllium plant is remarkable because its ground seeds can retain so much water. This product is available as Metamucil, Konsyl, Effersyllium, Per Diem Fiber, or the less expensive generic preparation in drug and health food stores. Although labeled a laxative, it really is not a laxative.  o Methylcellulose -- This is another fiber derived from wood which also retains water. It is available as Citrucel. o Polyethylene Glycol - and "artificial" fiber commonly called Miralax or Glycolax.  It is helpful for people with gassy or bloated feelings with regular fiber o Flax Seed - a less gassy fiber than psyllium   No reading or other relaxing activity while on the toilet. If bowel movements take longer than 5 minutes, you are too constipated   AVOID CONSTIPATION.  High fiber and water intake usually takes care of this.  Sometimes a laxative is needed to stimulate more frequent bowel movements, but    Laxatives are not a good long-term solution as it can wear the colon out. o Osmotics (Milk of Magnesia, Fleets phosphosoda, Magnesium citrate, MiraLax, GoLytely) are safer than  o Stimulants (Senokot, Castor Oil, Dulcolax, Ex Lax)    o Do not take laxatives for more than 7days in a row.    IF SEVERELY CONSTIPATED, try a Bowel Retraining Program: o Do not use laxatives.  o Eat a diet high in roughage, such as bran cereals and leafy vegetables.  o Drink six (6) ounces of prune or apricot juice each morning.  o Eat two (2) large servings of stewed fruit each day.  o Take one (1) heaping tablespoon of a psyllium-based bulking agent twice a day. Use sugar-free sweetener when possible to avoid excessive calories.  o Eat a normal breakfast.  o Set aside 15 minutes after breakfast to sit on the toilet, but do not strain  to have a bowel movement.  o If you do not have a bowel movement by the third day, use an enema and repeat the above steps.    Controlling diarrhea o Switch to liquids and simpler foods for a few days to avoid stressing your intestines further. o Avoid dairy products (especially milk & ice cream) for a short time.  The intestines often can lose the ability to digest lactose when stressed. o Avoid foods that cause gassiness or bloating.  Typical foods include beans and other legumes, cabbage, broccoli, and dairy foods.  Every person has some sensitivity to other foods, so listen to our body and avoid those foods that trigger problems for you. o Adding fiber (Citrucel, Metamucil, psyllium, Miralax) gradually can help thicken stools by absorbing excess fluid and retrain the intestines to act more normally.  Slowly increase the dose over a few weeks.  Too much fiber too soon can backfire and cause cramping & bloating. o Probiotics (such as active yogurt, Align, etc) may help repopulate the intestines and colon with  normal bacteria and calm down a sensitive digestive tract.  Most studies show it to be of mild help, though, and such products can be costly. o Medicines:   Bismuth subsalicylate (ex. Kayopectate, Pepto Bismol) every 30 minutes for up to 6 doses can help control diarrhea.  Avoid if pregnant.   Loperamide (Immodium) can slow down diarrhea.  Start with two tablets (  total) first and then try one tablet every 6 hours.  Avoid if you are having fevers or severe pain.  If you are not better or start feeling worse, stop all medicines and call your doctor for advice o Call your doctor if you are getting worse or not better.  Sometimes further testing (cultures, endoscopy, X-ray studies, bloodwork, etc) may be needed to help diagnose and treat the cause of the diarrhea. o

## 2013-10-29 NOTE — Progress Notes (Signed)
Patient ID: Kathleen West, female   DOB: [birth year], 38 y.o.   MRN: [id number]  Chief Complaint  Patient presents with  . Hemorrhoids    HPI [name] is a 38 y.o. female.   HPI 38 yo WF referred by Dr Okey Regal Web for evaluation of rectal pain and hemorrhoids. Pt states problem began a few months ago and has gotten better over the past few weeks but still having some occasional problems. She reports a period constipation several months ago and then developing severe pain with defecation. Felt like passing shards of glass and then would throb for awhile. Occasional blood on toilet paper. Got some relief with prepH. Sits on commode for less than 10 minutes. Doesn't really focus on high fiber diet or food choices. Doesn't drink lots of water. Reports daily bm. No prior colonoscopy.  Was told she may have internal hemorrhoids.   Past Medical History  Diagnosis Date  . Hx gestational diabetes   . 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 Dilation and curettage of uterus    . Colposcopy      Family History  Problem Relation Age of Onset  . Hypertension Father   . Diabetes Maternal Grandfather   . Cancer Sister     Cervical cancer    Social History History  Substance Use Topics  . Smoking status: Never Smoker   . Smokeless tobacco: Never Used  . Alcohol Use: 1.0 oz/week    2 drink(s) per week    Allergies  Allergen Reactions  . Codeine   . Doxycycline     Current Outpatient Prescriptions  Medication Sig Dispense Refill  . AMBULATORY NON FORMULARY MEDICATION Nifedipine 2% Apply liberal amount around anus topically twice a day for 6 weeks  30 g  1   Current Facility-Administered Medications  Medication Dose Route Frequency Provider Last Rate Last Dose  . levonorgestrel (MIRENA) 20 MCG/24HR IUD   Intrauterine Once Dara Lords, MD        Review of Systems Review of Systems  Constitutional: Negative for fever, activity change, appetite change and unexpected weight change.  HENT: Negative for nosebleeds and trouble swallowing.   Eyes: Negative for photophobia and visual disturbance.  Respiratory: Negative for chest tightness and shortness of breath.   Cardiovascular: Negative for chest pain and leg swelling.       Denies CP, SOB, orthopnea, PND, DOE  Genitourinary: Negative for dysuria and difficulty urinating.  Musculoskeletal: Negative for arthralgias.  Skin: Negative for pallor and rash.  Neurological: Negative for dizziness, seizures, facial asymmetry and numbness.  Hematological: Negative for adenopathy. Does not bruise/bleed easily.  Psychiatric/Behavioral: Negative for behavioral problems and agitation.    Blood pressure 126/80, pulse 77, temperature 97.5 F (36.4 C), height  (1.651 m), weight 183 lb (83.008 kg).  Physical Exam Physical Exam  Vitals reviewed. Constitutional: She is oriented to person, place, and time. She appears well-developed and well-nourished. No distress.  HENT:  Head: Normocephalic and atraumatic.  Right Ear: External ear normal.  Left Ear: External ear normal.  Eyes: Conjunctivae are normal. No scleral icterus.  Neck: Normal range of motion. Neck supple. No tracheal deviation present. No thyromegaly present.  Cardiovascular: Normal rate and normal heart sounds.   Pulmonary/Chest: Effort normal and breath sounds normal. No stridor. No  respiratory distress. She has no wheezes.  Abdominal: Soft. She exhibits no distension. There is no tenderness. There is no rebound and no guarding.  Genitourinary:  Visual inspection only - no thrombosed or prolapsed hemorrhoids. +post midline anal fissure. No DRE/anoscopy due to fissure.   Musculoskeletal: She exhibits no edema and no tenderness.  Lymphadenopathy:    She has no cervical adenopathy.  Neurological: She is alert  and oriented to person, place, and time. She exhibits normal muscle tone.  Skin: Skin is warm and dry. No rash noted. She is not diaphoretic. No erythema.  Psychiatric: She has a normal mood and affect. Her behavior is normal. Judgment and thought content normal.    Data Reviewed Dr Hyman Hopes  Assessment    Anal fissure     Plan    We discussed the etiology of anal fissures. The patient was given educational material as well as diagrams. We discussed nonoperative and operative management of anal fissures.  We discussed nonoperative management including correcting underlying bowel habits such as constipation, avoiding bathroom reading, avoiding straining with defecation. We discussed the importance of drinking 6-8 glasses of water a day and slowly adopting a high fiber diet to bulk her stools. We discussed fiber supplementation. We discussed slowly adding fiber in order to avoid bloating and cramping.  We also discussed the use of topical ointments such as nifedipine ointment. We also discussed the use of Botox injection.  With briefly discussed surgery but recommended starting with non-operative management first.  The patient has elected to start with non-op mgmt - nifedipine ointment twice a day; increasing water intake, high fiber diet, sitz bathes, avoiding ointments with hydrocortisone ointment. F/u 6-7 weeks  Mary Sella. Andrey Campanile, MD, FACS General, Bariatric, & Minimally Invasive Surgery [facility], [place]          [facility] M 10/29/2013, 5:52 PM

## 2013-12-14 ENCOUNTER — Encounter (INDEPENDENT_AMBULATORY_CARE_PROVIDER_SITE_OTHER): Payer: 59 | Admitting: General Surgery

## 2013-12-22 ENCOUNTER — Encounter: Payer: Self-pay | Admitting: Gynecology

## 2014-01-01 ENCOUNTER — Encounter (INDEPENDENT_AMBULATORY_CARE_PROVIDER_SITE_OTHER): Payer: Self-pay | Admitting: General Surgery

## 2014-01-12 ENCOUNTER — Encounter: Payer: Self-pay | Admitting: Gynecology

## 2014-01-12 ENCOUNTER — Other Ambulatory Visit: Payer: Self-pay | Admitting: Gynecology

## 2014-01-12 ENCOUNTER — Ambulatory Visit (INDEPENDENT_AMBULATORY_CARE_PROVIDER_SITE_OTHER): Payer: 59 | Admitting: Gynecology

## 2014-01-12 ENCOUNTER — Other Ambulatory Visit (HOSPITAL_COMMUNITY)
Admission: RE | Admit: 2014-01-12 | Discharge: 2014-01-12 | Disposition: A | Discharge status: Home / Self Care | Payer: 59 | Source: Ambulatory Visit | Attending: Gynecology | Admitting: Gynecology

## 2014-01-12 VITALS — BP 120/80 | Ht 65.0 in | Wt 183.0 lb

## 2014-01-12 DIAGNOSIS — Z309 Encounter for contraceptive management, unspecified: Secondary | ICD-10-CM

## 2014-01-12 DIAGNOSIS — Z1151 Encounter for screening for human papillomavirus (HPV): Secondary | ICD-10-CM | POA: Insufficient documentation

## 2014-01-12 DIAGNOSIS — Z01419 Encounter for gynecological examination (general) (routine) without abnormal findings: Secondary | ICD-10-CM | POA: Insufficient documentation

## 2014-01-12 DIAGNOSIS — A609 Anogenital herpesviral infection, unspecified: Secondary | ICD-10-CM

## 2014-01-12 DIAGNOSIS — R7309 Other abnormal glucose: Secondary | ICD-10-CM

## 2014-01-12 LAB — COMPREHENSIVE METABOLIC PANEL
ALBUMIN: 4.2 g/dL (ref 3.5–5.2)
ALK PHOS: 83 U/L (ref 39–117)
ALT: 21 U/L (ref 0–35)
AST: 15 U/L (ref 0–37)
BUN: 9 mg/dL (ref 6–23)
CALCIUM: 9.7 mg/dL (ref 8.4–10.5)
CO2: 30 mEq/L (ref 19–32)
CREATININE: 0.91 mg/dL (ref 0.50–1.10)
Chloride: 102 mEq/L (ref 96–112)
GLUCOSE: 107 mg/dL — AB (ref 70–99)
POTASSIUM: 4.7 meq/L (ref 3.5–5.3)
Sodium: 138 mEq/L (ref 135–145)
TOTAL PROTEIN: 6.9 g/dL (ref 6.0–8.3)
Total Bilirubin: 0.4 mg/dL (ref 0.2–1.2)

## 2014-01-12 LAB — CBC WITH DIFFERENTIAL/PLATELET
BASOS ABS: 0 10*3/uL (ref 0.0–0.1)
BASOS PCT: 0 % (ref 0–1)
EOS ABS: 0.1 10*3/uL (ref 0.0–0.7)
Eosinophils Relative: 1 % (ref 0–5)
HEMATOCRIT: 41.7 % (ref 36.0–46.0)
HEMOGLOBIN: 14.3 g/dL (ref 12.0–15.0)
LYMPHS ABS: 2.3 10*3/uL (ref 0.7–4.0)
Lymphocytes Relative: 29 % (ref 12–46)
MCH: 29.5 pg (ref 26.0–34.0)
MCHC: 34.3 g/dL (ref 30.0–36.0)
MCV: 86.2 fL (ref 78.0–100.0)
MONO ABS: 0.7 10*3/uL (ref 0.1–1.0)
MONOS PCT: 9 % (ref 3–12)
NEUTROS ABS: 4.8 10*3/uL (ref 1.7–7.7)
Neutrophils Relative %: 61 % (ref 43–77)
Platelets: 245 10*3/uL (ref 150–400)
RBC: 4.84 MIL/uL (ref 3.87–5.11)
RDW: 12.7 % (ref 11.5–15.5)
WBC: 7.9 10*3/uL (ref 4.0–10.5)

## 2014-01-12 LAB — LIPID PANEL
CHOL/HDL RATIO: 3.8 ratio
Cholesterol: 141 mg/dL (ref 0–200)
HDL: 37 mg/dL — ABNORMAL LOW (ref >39)
LDL Cholesterol: 79 mg/dL (ref 0–99)
Triglycerides: 123 mg/dL (ref <150)
VLDL: 25 mg/dL (ref 0–40)

## 2014-01-12 MED ORDER — VALACYCLOVIR HCL 500 MG PO TABS: 500.0000 mg | ORAL_TABLET | Freq: Two times a day (BID) | ORAL | Status: DC

## 2014-01-12 MED ORDER — DROSPIRENONE-ETHINYL ESTRADIOL 3-0.02 MG PO TABS: 1.0000 | ORAL_TABLET | Freq: Every day | ORAL | Status: DC

## 2014-01-12 NOTE — Progress Notes (Signed)
Kathleen West [birth year] [id number]        38 y.o.  [id number] for annual exam.  Several issues noted below.  Past medical history,surgical history, problem list, medications, allergies, family history and social history were all reviewed and documented as reviewed in the EPIC chart.  ROS:  12 system ROS performed with pertinent positives and negatives included in the history, assessment and plan.   Additional significant findings :  none   Exam: [name] personassistant Filed Vitals:   01/12/14 0821  BP: 120/80  Height: 5\' 5"  (1.651 m)  Weight: 183 lb (83.008 kg)   General appearance:  Normal affect, orientation and appearance. Skin: Grossly normal HEENT: Without gross lesions.  No cervical or supraclavicular adenopathy. Thyroid normal.  Lungs:  Clear without wheezing, rales or rhonchi Cardiac: RR, without RMG Abdominal:  Soft, nontender, without masses, guarding, rebound, organomegaly or hernia Breasts:  Examined lying and sitting without masses, retractions, discharge or axillary adenopathy. Pelvic:  Ext/BUS/vagina normal  Cervix normal. Pap/HPV  Uterus anteverted, normal size, shape and contour, midline and mobile nontender   Adnexa  Without masses or tenderness    Anus and perineum  Normal   Rectovaginal  Normal sphincter tone without palpated masses or tenderness.    Assessment/Plan:  38 y.o. [id number] female for annual exam with regular menses, condom contraception.   1. Birth control. Patient using condoms. Notes worsening acne/vulvar boils. Reviewed contraceptive options. Had tried the Mirena IUD but had acceptable side effects. Once a trial of oral contraceptives. Certainly will address both the contraceptive and ovarian suppression issues. Various choices reviewed and ultimately we decided to start Yaz equivalent. I reviewed the risks of birth control pills in general and specifically with Yaz with increased risk of stroke, heart attack, DVT. Never smoked and not being followed for  medical issues. We'll go ahead and initiate now and she'll call me if she has any issues with this. Refill 1 year provided. 2. Genital HSV. Occasional outbreaks for which she uses intermittent Valtrex 500 mg twice a day times several days. #30 with 1 refill provided. 3. Pap smear 2012. Pap/HPV today. History of LGSIL in 2007, 2008. Negative since 2009. Plan repeat Pap smear at 3-5 year interval assuming this Pap smear is normal per current screening guidelines. 4. Mammography. Reviewed screening mammographic recommendations between 35 and 40. No strong family history and she prefers to wait till 7540. SBE monthly reviewed. 5. Health maintenance. Baseline CBC comprehensive metabolic panel lipid profile urinalysis ordered. Follow up in one year's imaged as well as the pills, sooner if any issues.     [name],Ulysees Robarts P MD, 8:46 AM 01/12/2014

## 2014-01-12 NOTE — Patient Instructions (Signed)
Start on new birth control pill. Call me if you have any issues with this.  You may obtain a copy of any labs that were done today by logging onto MyChart as outlined in the instructions provided with your AVS (after visit summary). The office will not call with normal lab results but certainly if there are any significant abnormalities then we will contact you.   Health Maintenance, Female A healthy lifestyle and preventative care can promote health and wellness.  Maintain regular health, dental, and eye exams.  Eat a healthy diet. Foods like vegetables, fruits, whole grains, low-fat dairy products, and lean protein foods contain the nutrients you need without too many calories. Decrease your intake of foods high in solid fats, added sugars, and salt. Get information about a proper diet from your caregiver, if necessary.  Regular physical exercise is one of the most important things you can do for your health. Most adults should get at least 150 minutes of moderate-intensity exercise (any activity that increases your heart rate and causes you to sweat) each week. In addition, most adults need muscle-strengthening exercises on 2 or more days a week.   Maintain a healthy weight. The body mass index (BMI) is a screening tool to identify possible weight problems. It provides an estimate of body fat based on height and weight. Your caregiver can help determine your BMI, and can help you achieve or maintain a healthy weight. For adults 20 years and older:  A BMI below 18.5 is considered underweight.  A BMI of 18.5 to 24.9 is normal.  A BMI of 25 to 29.9 is considered overweight.  A BMI of 30 and above is considered obese.  Maintain normal blood lipids and cholesterol by exercising and minimizing your intake of saturated fat. Eat a balanced diet with plenty of fruits and vegetables. Blood tests for lipids and cholesterol should begin at age 66 and be repeated every 5 years. If your lipid or  cholesterol levels are high, you are over 50, or you are a high risk for heart disease, you may need your cholesterol levels checked more frequently.Ongoing high lipid and cholesterol levels should be treated with medicines if diet and exercise are not effective.  If you smoke, find out from your caregiver how to quit. If you do not use tobacco, do not start.  Lung cancer screening is recommended for adults aged 30 80 years who are at high risk for developing lung cancer because of a history of smoking. Yearly low-dose computed tomography (CT) is recommended for people who have at least a 30-pack-year history of smoking and are a current smoker or have quit within the past 15 years. A pack year of smoking is smoking an average of 1 pack of cigarettes a day for 1 year (for example: 1 pack a day for 30 years or 2 packs a day for 15 years). Yearly screening should continue until the smoker has stopped smoking for at least 15 years. Yearly screening should also be stopped for people who develop a health problem that would prevent them from having lung cancer treatment.  If you are pregnant, do not drink alcohol. If you are breastfeeding, be very cautious about drinking alcohol. If you are not pregnant and choose to drink alcohol, do not exceed 1 drink per day. One drink is considered to be 12 ounces (355 mL) of beer, 5 ounces (148 mL) of wine, or 1.5 ounces (44 mL) of liquor.  Avoid use of street drugs. Do  not share needles with anyone. Ask for help if you need support or instructions about stopping the use of drugs.  High blood pressure causes heart disease and increases the risk of stroke. Blood pressure should be checked at least every 1 to 2 years. Ongoing high blood pressure should be treated with medicines, if weight loss and exercise are not effective.  If you are 42 to 38 years old, ask your caregiver if you should take aspirin to prevent strokes.  Diabetes screening involves taking a blood sample  to check your fasting blood sugar level. This should be done once every 3 years, after age 68, if you are within normal weight and without risk factors for diabetes. Testing should be considered at a younger age or be carried out more frequently if you are overweight and have at least 1 risk factor for diabetes.  Breast cancer screening is essential preventative care for women. You should practice "breast self-awareness." This means understanding the normal appearance and feel of your breasts and may include breast self-examination. Any changes detected, no matter how small, should be reported to a caregiver. Women in their 70s and 30s should have a clinical breast exam (CBE) by a caregiver as part of a regular health exam every 1 to 3 years. After age 84, women should have a CBE every year. Starting at age 66, women should consider having a mammogram (breast X-ray) every year. Women who have a family history of breast cancer should talk to their caregiver about genetic screening. Women at a high risk of breast cancer should talk to their caregiver about having an MRI and a mammogram every year.  Breast cancer gene (BRCA)-related cancer risk assessment is recommended for women who have family members with BRCA-related cancers. BRCA-related cancers include breast, ovarian, tubal, and peritoneal cancers. Having family members with these cancers may be associated with an increased risk for harmful changes (mutations) in the breast cancer genes BRCA1 and BRCA2. Results of the assessment will determine the need for genetic counseling and BRCA1 and BRCA2 testing.  The Pap test is a screening test for cervical cancer. Women should have a Pap test starting at age 40. Between ages 72 and 14, Pap tests should be repeated every 2 years. Beginning at age 22, you should have a Pap test every 3 years as long as the past 3 Pap tests have been normal. If you had a hysterectomy for a problem that was not cancer or a condition  that could lead to cancer, then you no longer need Pap tests. If you are between ages 47 and 66, and you have had normal Pap tests going back 10 years, you no longer need Pap tests. If you have had past treatment for cervical cancer or a condition that could lead to cancer, you need Pap tests and screening for cancer for at least 20 years after your treatment. If Pap tests have been discontinued, risk factors (such as a new sexual partner) need to be reassessed to determine if screening should be resumed. Some women have medical problems that increase the chance of getting cervical cancer. In these cases, your caregiver may recommend more frequent screening and Pap tests.  The human papillomavirus (HPV) test is an additional test that may be used for cervical cancer screening. The HPV test looks for the virus that can cause the cell changes on the cervix. The cells collected during the Pap test can be tested for HPV. The HPV test could be used to  screen women aged 10 years and older, and should be used in women of any age who have unclear Pap test results. After the age of 3, women should have HPV testing at the same frequency as a Pap test.  Colorectal cancer can be detected and often prevented. Most routine colorectal cancer screening begins at the age of 18 and continues through age 37. However, your caregiver may recommend screening at an earlier age if you have risk factors for colon cancer. On a yearly basis, your caregiver may provide home test kits to check for hidden blood in the stool. Use of a small camera at the end of a tube, to directly examine the colon (sigmoidoscopy or colonoscopy), can detect the earliest forms of colorectal cancer. Talk to your caregiver about this at age 51, when routine screening begins. Direct examination of the colon should be repeated every 5 to 10 years through age 15, unless early forms of pre-cancerous polyps or small growths are found.  Hepatitis C blood testing is  recommended for all people born from [birth year] through 1965 and any individual with known risks for hepatitis C.  Practice safe sex. Use condoms and avoid high-risk sexual practices to reduce the spread of sexually transmitted infections (STIs). Sexually active women aged 61 and younger should be checked for Chlamydia, which is a common sexually transmitted infection. Older women with new or multiple partners should also be tested for Chlamydia. Testing for other STIs is recommended if you are sexually active and at increased risk.  Osteoporosis is a disease in which the bones lose minerals and strength with aging. This can result in serious bone fractures. The risk of osteoporosis can be identified using a bone density scan. Women ages 58 and over and women at risk for fractures or osteoporosis should discuss screening with their caregivers. Ask your caregiver whether you should be taking a calcium supplement or vitamin D to reduce the rate of osteoporosis.  Menopause can be associated with physical symptoms and risks. Hormone replacement therapy is available to decrease symptoms and risks. You should talk to your caregiver about whether hormone replacement therapy is right for you.  Use sunscreen. Apply sunscreen liberally and repeatedly throughout the day. You should seek shade when your shadow is shorter than you. Protect yourself by wearing long sleeves, pants, a wide-brimmed hat, and sunglasses year round, whenever you are outdoors.  Notify your caregiver of new moles or changes in moles, especially if there is a change in shape or color. Also notify your caregiver if a mole is larger than the size of a pencil eraser.  Stay current with your immunizations. Document Released: 09/01/2010 Document Revised: 06/13/2012 Document Reviewed: 09/01/2010 [facility] Patient Information 2014 [place].

## 2014-01-12 NOTE — Addendum Note (Signed)
Addended by: Dayna BarkerGARDNER, KIMBERLY K on: 01/12/2014 09:01 AM   Modules accepted: Orders, SmartSet

## 2014-01-13 LAB — URINALYSIS W MICROSCOPIC + REFLEX CULTURE
Bilirubin Urine: NEGATIVE
CASTS: NONE SEEN
Crystals: NONE SEEN
Glucose, UA: NEGATIVE mg/dL
HGB URINE DIPSTICK: NEGATIVE
KETONES UR: NEGATIVE mg/dL
LEUKOCYTES UA: NEGATIVE
NITRITE: NEGATIVE
PH: 5 (ref 5.0–8.0)
Protein, ur: NEGATIVE mg/dL
Specific Gravity, Urine: 1.026 (ref 1.005–1.030)
UROBILINOGEN UA: 0.2 mg/dL (ref 0.0–1.0)

## 2014-01-15 LAB — CYTOLOGY - PAP

## 2014-01-17 ENCOUNTER — Other Ambulatory Visit: Payer: Self-pay | Admitting: Gynecology

## 2014-01-17 MED ORDER — SULFAMETHOXAZOLE-TRIMETHOPRIM 800-160 MG PO TABS: 1.0000 | ORAL_TABLET | Freq: Two times a day (BID) | ORAL | Status: DC

## 2014-01-18 LAB — URINE CULTURE

## 2014-01-23 ENCOUNTER — Telehealth: Payer: Self-pay | Admitting: *Deleted

## 2014-01-23 MED ORDER — CIPROFLOXACIN HCL 250 MG PO TABS: 250.0000 mg | ORAL_TABLET | Freq: Two times a day (BID) | ORAL | Status: DC

## 2014-01-23 NOTE — Telephone Encounter (Signed)
Ciprofloxacin 250 mg twice a day 7 days. 

## 2014-01-23 NOTE — Telephone Encounter (Signed)
Pt informed, rx sent 

## 2014-01-23 NOTE — Telephone Encounter (Signed)
Pt was treated with UTI on 01/17/14 with Septra DS 1 by mouth twice a day 3 days. States before no symptoms took Rx and now symptoms c/o urgency wll be traveling for work for 2 [place] and doesn't want infection to repeat. Please advise

## 2014-05-28 ENCOUNTER — Telehealth: Payer: Self-pay | Admitting: Family Medicine

## 2014-05-28 NOTE — Telephone Encounter (Signed)
Patient has a flight out of town on the date of her appt ( Wednesday). This is an establishment visit. The next opening appears to be 06/13/2014. She states that she might have to seek care elsewhere if she has to wait that long. Is there any place where we can squeeze her in, possibly tomorrow (since we do have a few 15 slots). Please let me know or call the patient. Thanks so much

## 2014-05-28 NOTE — Telephone Encounter (Signed)
Spoke to pt, scheduled her tomorrow @ 930am.

## 2014-05-29 ENCOUNTER — Other Ambulatory Visit (INDEPENDENT_AMBULATORY_CARE_PROVIDER_SITE_OTHER): Payer: 59

## 2014-05-29 ENCOUNTER — Ambulatory Visit (INDEPENDENT_AMBULATORY_CARE_PROVIDER_SITE_OTHER): Payer: 59 | Admitting: Family Medicine

## 2014-05-29 ENCOUNTER — Encounter: Payer: Self-pay | Admitting: Family Medicine

## 2014-05-29 VITALS — BP 110/80 | HR 79 | Ht 65.0 in | Wt 181.0 lb

## 2014-05-29 DIAGNOSIS — M25511 Pain in right shoulder: Secondary | ICD-10-CM

## 2014-05-29 DIAGNOSIS — M7551 Bursitis of right shoulder: Secondary | ICD-10-CM | POA: Diagnosis not present

## 2014-05-29 DIAGNOSIS — M7550 Bursitis of unspecified shoulder: Secondary | ICD-10-CM | POA: Insufficient documentation

## 2014-05-29 NOTE — Progress Notes (Signed)
Pre visit review using our clinic review tool, if applicable. No additional management support is needed unless otherwise documented below in the visit note. 

## 2014-05-29 NOTE — Patient Instructions (Signed)
Good to see you Ice 20 minutes 2 times daily. Usually after activity and before bed. Exercises 3 times a week.  Vitamin D 2000 IU daily Pennsaid twice daily See me again in 3 weeks and if not perfect we will try manipulation.

## 2014-05-29 NOTE — Progress Notes (Signed)
Tawana ScaleZach Chanita Boden D.O. [facility] [address] [place], [place] [address] Phone: [phone] Subjective:    I'm seeing this patient by the request  of:  WOLTERS,SHARON A, MD   CC: Right arm pain  HQI:[id number]:Subjective Kathleen West is a 39 y.o. female coming in with complaint of finger pain. Patient states she's had a dull throbbing aching sensation for the last multiple months. Patient does not remember any true injury. States that the pain seems to start approximate on her right shoulder and radiate down her arm intermittently. Stops at the elbow.  Patient rates the severity of pain as 5 out of 10. Not responding to over-the-counter natural vitamins or anti-inflammatory's. Patient states it is starting to affect some of her daily activities such as dressing. Patient states he can be very uncomfortable at night as well. Does not remember any injury.     Past medical history, social, surgical and family history all reviewed in electronic medical record.   Review of Systems: No headache, visual changes, nausea, vomiting, diarrhea, constipation, dizziness, abdominal pain, skin rash, fevers, chills, night sweats, weight loss, swollen lymph nodes, body aches, joint swelling, muscle aches, chest pain, shortness of breath, mood changes.   Objective Blood pressure 110/80, pulse 79, height 5\' 5"  (1.651 m), weight 181 lb (82.101 kg), SpO2 97 %.  General: No apparent distress alert and oriented x3 mood and affect normal, dressed appropriately.  HEENT: Pupils equal, extraocular movements intact  Respiratory: Patient's speak in full sentences and does not appear short of breath  Cardiovascular: No lower extremity edema, non tender, no erythema  Skin: Warm dry intact with no signs of infection or rash on extremities or on axial skeleton.  Abdomen: Soft nontender  Neuro: Cranial nerves II through XII are intact, neurovascularly intact in all extremities with 2+ DTRs and 2+ pulses.  Lymph: No  lymphadenopathy of posterior or anterior cervical chain or axillae bilaterally.  Gait normal with good balance and coordination.  MSK:  Non tender with full range of motion and good stability and symmetric strength and tone of  elbows, wrist, hip, knee and ankles bilaterally.  Shoulder: Right Inspection reveals no abnormalities, atrophy or asymmetry. Palpation is normal with no tenderness over AC joint or bicipital groove. ROM is full in all planes passively. Rotator cuff strength normal throughout. signs of impingement with positive Neer and Hawkin's tests, but negative empty can sign. Speeds and Yergason's tests normal. No labral pathology noted with negative Obrien's, negative clunk and good stability. Normal scapular function observed. No painful arc and no drop arm sign. No apprehension sign Contralateral shoulder unremarkable  MSK US performed of: Right This study was ordered, performed, and interpreted by Terrilee FilesZach Devaris Quirk D.O.  Shoulder:   Supraspinatus:  Appears normal on long and transverse views, Bursal bulge seen with shoulder abduction on impingement view. Infraspinatus:  Appears normal on long and transverse views. Significant increase in Doppler flow Subscapularis:  Appears normal on long and transverse views. Positive bursa Teres Minor:  Appears normal on long and transverse views. AC joint:  Capsule undistended, no geyser sign. Glenohumeral Joint:  Appears normal without effusion. Glenoid Labrum:  Intact without visualized tears. Biceps Tendon:  Appears normal on long and transverse views, no fraying of tendon, tendon located in intertubercular groove, no subluxation with shoulder internal or external rotation.  Impression: Subacromial bursitis  Procedure: Real-time Ultrasound Guided Injection of right glenohumeral joint Device: GE Logiq E  Ultrasound guided injection is preferred based studies that show  increased duration, increased effect, greater accuracy, decreased  procedural pain, increased response rate with ultrasound guided versus blind injection.  Verbal informed consent obtained.  Time-out conducted.  Noted no overlying erythema, induration, or other signs of local infection.  Skin prepped in a sterile fashion.  Local anesthesia: Topical Ethyl chloride.  With sterile technique and under real time ultrasound guidance:  Joint visualized.  23g 1  inch needle inserted posterior approach. Pictures taken for needle placement. Patient did have injection of 2 cc of 1% lidocaine, 2 cc of 0.5% Marcaine, and 1.0 cc of Kenalog 40 mg/dL. Completed without difficulty  Pain immediately resolved suggesting accurate placement of the medication.  Advised to call if fevers/chills, erythema, induration, drainage, or persistent bleeding.  Images permanently stored and available for review in the ultrasound unit.  Impression: Technically successful ultrasound guided injection.     Impression and Recommendations:     This case required medical decision making of moderate complexity.

## 2014-05-29 NOTE — Assessment & Plan Note (Signed)
Patient given injection today. Patient will do home exercises and topical anti-inflammatories. We discussed over-the-counter medications as well. Patient did learn home exercises in greater detail today. Patient and will follow-up with me again in 3 weeks for further evaluation and treatment.

## 2014-05-30 ENCOUNTER — Ambulatory Visit: Payer: Self-pay | Admitting: Family Medicine

## 2015-05-08 ENCOUNTER — Encounter: Payer: Self-pay | Admitting: Gynecology

## 2015-06-10 ENCOUNTER — Encounter: Payer: Self-pay | Admitting: Gynecology

## 2015-06-10 ENCOUNTER — Ambulatory Visit (INDEPENDENT_AMBULATORY_CARE_PROVIDER_SITE_OTHER): Payer: 59 | Admitting: Gynecology

## 2015-06-10 VITALS — BP 122/78 | Ht 65.5 in | Wt 187.0 lb

## 2015-06-10 DIAGNOSIS — Z01419 Encounter for gynecological examination (general) (routine) without abnormal findings: Secondary | ICD-10-CM

## 2015-06-10 DIAGNOSIS — Z13220 Encounter for screening for lipoid disorders: Secondary | ICD-10-CM | POA: Diagnosis not present

## 2015-06-10 LAB — CBC WITH DIFFERENTIAL/PLATELET
Basophils Absolute: 0 cells/uL (ref 0–200)
Basophils Relative: 0 %
EOS PCT: 1 %
Eosinophils Absolute: 73 cells/uL (ref 15–500)
HCT: 40.7 % (ref 35.0–45.0)
HEMOGLOBIN: 13.6 g/dL (ref 11.7–15.5)
LYMPHS ABS: 1825 {cells}/uL (ref 850–3900)
Lymphocytes Relative: 25 %
MCH: 30 pg (ref 27.0–33.0)
MCHC: 33.4 g/dL (ref 32.0–36.0)
MCV: 89.6 fL (ref 80.0–100.0)
MONOS PCT: 11 %
MPV: 8.8 fL (ref 7.5–12.5)
Monocytes Absolute: 803 cells/uL (ref 200–950)
NEUTROS PCT: 63 %
Neutro Abs: 4599 cells/uL (ref 1500–7800)
PLATELETS: 232 10*3/uL (ref 140–400)
RBC: 4.54 MIL/uL (ref 3.80–5.10)
RDW: 13.2 % (ref 11.0–15.0)
WBC: 7.3 10*3/uL (ref 3.8–10.8)

## 2015-06-10 LAB — URINALYSIS W MICROSCOPIC + REFLEX CULTURE
BILIRUBIN URINE: NEGATIVE
Casts: NONE SEEN [LPF]
Glucose, UA: NEGATIVE
Hgb urine dipstick: NEGATIVE
KETONES UR: NEGATIVE
Leukocytes, UA: NEGATIVE
NITRITE: NEGATIVE
PH: 5.5 (ref 5.0–8.0)
Protein, ur: NEGATIVE
RBC / HPF: NONE SEEN RBC/HPF (ref <=2)
SPECIFIC GRAVITY, URINE: 1.027 (ref 1.001–1.035)
Yeast: NONE SEEN [HPF]

## 2015-06-10 LAB — COMPREHENSIVE METABOLIC PANEL
ALBUMIN: 4 g/dL (ref 3.6–5.1)
ALT: 23 U/L (ref 6–29)
AST: 14 U/L (ref 10–30)
Alkaline Phosphatase: 67 U/L (ref 33–115)
BILIRUBIN TOTAL: 0.5 mg/dL (ref 0.2–1.2)
BUN: 10 mg/dL (ref 7–25)
CHLORIDE: 104 mmol/L (ref 98–110)
CO2: 25 mmol/L (ref 20–31)
CREATININE: 0.71 mg/dL (ref 0.50–1.10)
Calcium: 9 mg/dL (ref 8.6–10.2)
GLUCOSE: 105 mg/dL — AB (ref 65–99)
Potassium: 4.3 mmol/L (ref 3.5–5.3)
SODIUM: 138 mmol/L (ref 135–146)
Total Protein: 6.4 g/dL (ref 6.1–8.1)

## 2015-06-10 LAB — LIPID PANEL
Cholesterol: 152 mg/dL (ref 125–200)
HDL: 39 mg/dL — ABNORMAL LOW (ref >=46)
LDL CALC: 94 mg/dL (ref <130)
TRIGLYCERIDES: 95 mg/dL (ref <150)
Total CHOL/HDL Ratio: 3.9 Ratio (ref <=5.0)
VLDL: 19 mg/dL (ref <30)

## 2015-06-10 MED ORDER — VALACYCLOVIR HCL 500 MG PO TABS: 500.0000 mg | ORAL_TABLET | Freq: Two times a day (BID) | ORAL | Status: DC

## 2015-06-10 MED ORDER — NORETHINDRONE ACET-ETHINYL EST 1-20 MG-MCG PO TABS: 1.0000 | ORAL_TABLET | Freq: Every day | ORAL | Status: DC

## 2015-06-10 NOTE — Patient Instructions (Signed)
Start on the birth control pills as we discussed. Call me if you have any issues with this.  You may obtain a copy of any labs that were done today by logging onto MyChart as outlined in the instructions provided with your AVS (after visit summary). The office will not call with normal lab results but certainly if there are any significant abnormalities then we will contact you.   Health Maintenance Adopting a healthy lifestyle and getting preventive care can go a long way to promote health and wellness. Talk with your health care provider about what schedule of regular examinations is right for you. This is a good chance for you to check in with your provider about disease prevention and staying healthy. In between checkups, there are plenty of things you can do on your own. Experts have done a lot of research about which lifestyle changes and preventive measures are most likely to keep you healthy. Ask your health care provider for more information. WEIGHT AND DIET  Eat a healthy diet  Be sure to include plenty of vegetables, fruits, low-fat dairy products, and lean protein.  Do not eat a lot of foods high in solid fats, added sugars, or salt.  Get regular exercise. This is one of the most important things you can do for your health.  Most adults should exercise for at least 150 minutes each week. The exercise should increase your heart rate and make you sweat (moderate-intensity exercise).  Most adults should also do strengthening exercises at least twice a week. This is in addition to the moderate-intensity exercise.  Maintain a healthy weight  Body mass index (BMI) is a measurement that can be used to identify possible weight problems. It estimates body fat based on height and weight. Your health care provider can help determine your BMI and help you achieve or maintain a healthy weight.  For females 34 years of age and older:   A BMI below 18.5 is considered underweight.  A BMI of  18.5 to 24.9 is normal.  A BMI of 25 to 29.9 is considered overweight.  A BMI of 30 and above is considered obese.  Watch levels of cholesterol and blood lipids  You should start having your blood tested for lipids and cholesterol at 40 years of age, then have this test every 5 years.  You may need to have your cholesterol levels checked more often if:  Your lipid or cholesterol levels are high.  You are older than 40 years of age.  You are at high risk for heart disease.  CANCER SCREENING   Lung Cancer  Lung cancer screening is recommended for adults 103-62 years old who are at high risk for lung cancer because of a history of smoking.  A yearly low-dose CT scan of the lungs is recommended for people who:  Currently smoke.  Have quit within the past 15 years.  Have at least a 30-pack-year history of smoking. A pack year is smoking an average of one pack of cigarettes a day for 1 year.  Yearly screening should continue until it has been 15 years since you quit.  Yearly screening should stop if you develop a health problem that would prevent you from having lung cancer treatment.  Breast Cancer  Practice breast self-awareness. This means understanding how your breasts normally appear and feel.  It also means doing regular breast self-exams. Let your health care provider know about any changes, no matter how small.  If you are in  your 20s or 30s, you should have a clinical breast exam (CBE) by a health care provider every 1-3 years as part of a regular health exam.  If you are 110 or older, have a CBE every year. Also consider having a breast X-ray (mammogram) every year.  If you have a family history of breast cancer, talk to your health care provider about genetic screening.  If you are at high risk for breast cancer, talk to your health care provider about having an MRI and a mammogram every year.  Breast cancer gene (BRCA) assessment is recommended for women who  have family members with BRCA-related cancers. BRCA-related cancers include:  Breast.  Ovarian.  Tubal.  Peritoneal cancers.  Results of the assessment will determine the need for genetic counseling and BRCA1 and BRCA2 testing. Cervical Cancer Routine pelvic examinations to screen for cervical cancer are no longer recommended for nonpregnant women who are considered low risk for cancer of the pelvic organs (ovaries, uterus, and vagina) and who do not have symptoms. A pelvic examination may be necessary if you have symptoms including those associated with pelvic infections. Ask your health care provider if a screening pelvic exam is right for you.   The Pap test is the screening test for cervical cancer for women who are considered at risk.  If you had a hysterectomy for a problem that was not cancer or a condition that could lead to cancer, then you no longer need Pap tests.  If you are older than 65 years, and you have had normal Pap tests for the past 10 years, you no longer need to have Pap tests.  If you have had past treatment for cervical cancer or a condition that could lead to cancer, you need Pap tests and screening for cancer for at least 20 years after your treatment.  If you no longer get a Pap test, assess your risk factors if they change (such as having a new sexual partner). This can affect whether you should start being screened again.  Some women have medical problems that increase their chance of getting cervical cancer. If this is the case for you, your health care provider may recommend more frequent screening and Pap tests.  The human papillomavirus (HPV) test is another test that may be used for cervical cancer screening. The HPV test looks for the virus that can cause cell changes in the cervix. The cells collected during the Pap test can be tested for HPV.  The HPV test can be used to screen women 38 years of age and older. Getting tested for HPV can extend the  interval between normal Pap tests from three to five years.  An HPV test also should be used to screen women of any age who have unclear Pap test results.  After 40 years of age, women should have HPV testing as often as Pap tests.  Colorectal Cancer  This type of cancer can be detected and often prevented.  Routine colorectal cancer screening usually begins at 40 years of age and continues through 40 years of age.  Your health care provider may recommend screening at an earlier age if you have risk factors for colon cancer.  Your health care provider may also recommend using home test kits to check for hidden blood in the stool.  A small camera at the end of a tube can be used to examine your colon directly (sigmoidoscopy or colonoscopy). This is done to check for the earliest forms of  colorectal cancer.  Routine screening usually begins at age 39.  Direct examination of the colon should be repeated every 5-10 years through 40 years of age. However, you may need to be screened more often if early forms of precancerous polyps or small growths are found. Skin Cancer  Check your skin from head to toe regularly.  Tell your health care provider about any new moles or changes in moles, especially if there is a change in a mole's shape or color.  Also tell your health care provider if you have a mole that is larger than the size of a pencil eraser.  Always use sunscreen. Apply sunscreen liberally and repeatedly throughout the day.  Protect yourself by wearing long sleeves, pants, a wide-brimmed hat, and sunglasses whenever you are outside. HEART DISEASE, DIABETES, AND HIGH BLOOD PRESSURE   Have your blood pressure checked at least every 1-2 years. High blood pressure causes heart disease and increases the risk of stroke.  If you are between 62 years and 22 years old, ask your health care provider if you should take aspirin to prevent strokes.  Have regular diabetes screenings. This  involves taking a blood sample to check your fasting blood sugar level.  If you are at a normal weight and have a low risk for diabetes, have this test once every three years after 40 years of age.  If you are overweight and have a high risk for diabetes, consider being tested at a younger age or more often. PREVENTING INFECTION  Hepatitis B  If you have a higher risk for hepatitis B, you should be screened for this virus. You are considered at high risk for hepatitis B if:  You were born in a country where hepatitis B is common. Ask your health care provider which countries are considered high risk.  Your parents were born in a high-risk country, and you have not been immunized against hepatitis B (hepatitis B vaccine).  You have HIV or AIDS.  You use needles to inject street drugs.  You live with someone who has hepatitis B.  You have had sex with someone who has hepatitis B.  You get hemodialysis treatment.  You take certain medicines for conditions, including cancer, organ transplantation, and autoimmune conditions. Hepatitis C  Blood testing is recommended for:  Everyone born from [birth year] through 1965.  Anyone with known risk factors for hepatitis C. Sexually transmitted infections (STIs)  You should be screened for sexually transmitted infections (STIs) including gonorrhea and chlamydia if:  You are sexually active and are younger than 40 years of age.  You are older than 40 years of age and your health care provider tells you that you are at risk for this type of infection.  Your sexual activity has changed since you were last screened and you are at an increased risk for chlamydia or gonorrhea. Ask your health care provider if you are at risk.  If you do not have HIV, but are at risk, it may be recommended that you take a prescription medicine daily to prevent HIV infection. This is called pre-exposure prophylaxis (PrEP). You are considered at risk if:  You are  sexually active and do not regularly use condoms or know the HIV status of your partner(s).  You take drugs by injection.  You are sexually active with a partner who has HIV. Talk with your health care provider about whether you are at high risk of being infected with HIV. If you choose to begin PrEP,  you should first be tested for HIV. You should then be tested every 3 months for as long as you are taking PrEP.  PREGNANCY   If you are premenopausal and you may become pregnant, ask your health care provider about preconception counseling.  If you may become pregnant, take 400 to 800 micrograms (mcg) of folic acid every day.  If you want to prevent pregnancy, talk to your health care provider about birth control (contraception). OSTEOPOROSIS AND MENOPAUSE   Osteoporosis is a disease in which the bones lose minerals and strength with aging. This can result in serious bone fractures. Your risk for osteoporosis can be identified using a bone density scan.  If you are 34 years of age or older, or if you are at risk for osteoporosis and fractures, ask your health care provider if you should be screened.  Ask your health care provider whether you should take a calcium or vitamin D supplement to lower your risk for osteoporosis.  Menopause may have certain physical symptoms and risks.  Hormone replacement therapy may reduce some of these symptoms and risks. Talk to your health care provider about whether hormone replacement therapy is right for you.  HOME CARE INSTRUCTIONS   Schedule regular health, dental, and eye exams.  Stay current with your immunizations.   Do not use any tobacco products including cigarettes, chewing tobacco, or electronic cigarettes.  If you are pregnant, do not drink alcohol.  If you are breastfeeding, limit how much and how often you drink alcohol.  Limit alcohol intake to no more than 1 drink per day for nonpregnant women. One drink equals 12 ounces of beer, 5  ounces of wine, or 1 ounces of hard liquor.  Do not use street drugs.  Do not share needles.  Ask your health care provider for help if you need support or information about quitting drugs.  Tell your health care provider if you often feel depressed.  Tell your health care provider if you have ever been abused or do not feel safe at home. Document Released: 09/01/2010 Document Revised: 07/03/2013 Document Reviewed: 01/18/2013 [facility] Patient Information 2015 [place], [place]. This information is not intended to replace advice given to you by your health care provider. Make sure you discuss any questions you have with your health care provider.

## 2015-06-10 NOTE — Progress Notes (Addendum)
    Nadara ModeKorie G Carrithers September 28, 1975 [id number]        40 y.o.  [id number]  for annual exam.  Several issues noted below.  Past medical history,surgical history, problem list, medications, allergies, family history and social history were all reviewed and documented as reviewed in the EPIC chart.  ROS:  Performed with pertinent positives and negatives included in the history, assessment and plan.   Additional significant findings :  none   Exam: Kennon PortelaKim Gardner assistant Filed Vitals:   06/10/15 0831  BP: 122/78  Height: 5' 5.5" (1.664 m)  Weight: 187 lb (84.823 kg)   General appearance:  Normal affect, orientation and appearance. Skin: Grossly normal HEENT: Without gross lesions.  No cervical or supraclavicular adenopathy. Thyroid normal.  Lungs:  Clear without wheezing, rales or rhonchi Cardiac: RR, without RMG Abdominal:  Soft, nontender, without masses, guarding, rebound, organomegaly or hernia Breasts:  Examined lying and sitting without masses, retractions, discharge or axillary adenopathy. Pelvic:  Ext/BUS/vagina normal excepting multiple small boils over mons pubis  Cervix normal. Pap smear done  Uterus anteverted, normal size, shape and contour, midline and mobile nontender   Adnexa without masses or tenderness    Anus and perineum normal   Rectovaginal normal sphincter tone without palpated masses or tenderness.    Assessment/Plan:  40 y.o. [id number] female for annual exam , regular menses, condom contraception.   1. Multiple boils, mons pubis. Never started birth control pills as discussed last year. Saw dermatologist and was started on antibiotics and antiseptic wash. Has not helped. Again discussed the physiology of ovarian androgen production. Recommend starting low-dose oral contraceptives to see if this does not help. Risks to include increased risk of stroke heart attack DVT reviewed.  Patient never smoker and not being followed for any medical issues. We'll go ahead and start  Loestrin 1/20 equivalent. Every other day every third month withdrawal option also discussed. Offbrand labeling reviewed. Will call me if she has any issues with this. 2. Contraception. Using condoms now although notes very infrequent intercourse. Will start oral contraceptives as above. 3. Breast health. Plans mammogram this coming year as she turns 40. SBE monthly reviewed. 4. Pap smear/HPV negative 12/2013. No endocervical cells seen. Pap smear repeated today. 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 5. History of HSV with occasional outbreak. Valtrex 500 mg twice a day times several days without break. #30 with 1 refill provided. 6. Health maintenance. Baseline CBC, CMP, lipid profile, urinalysis ordered. Follow up in one year, sooner if any issues.   [name],Dontay Harm P MD, 8:54 AM 06/10/2015

## 2015-06-10 NOTE — Addendum Note (Signed)
Addended by: Dayna BarkerGARDNER, KIMBERLY K on: 06/10/2015 09:21 AM   Modules accepted: Orders, SmartSet

## 2015-06-11 ENCOUNTER — Other Ambulatory Visit: Payer: Self-pay | Admitting: Gynecology

## 2015-06-11 DIAGNOSIS — R7309 Other abnormal glucose: Secondary | ICD-10-CM

## 2015-06-11 LAB — PAP IG W/ RFLX HPV ASCU

## 2015-06-11 LAB — URINE CULTURE
COLONY COUNT: NO GROWTH
ORGANISM ID, BACTERIA: NO GROWTH

## 2015-06-13 ENCOUNTER — Other Ambulatory Visit: Payer: Self-pay

## 2015-12-23 ENCOUNTER — Telehealth: Payer: Self-pay | Admitting: *Deleted

## 2015-12-23 NOTE — Telephone Encounter (Signed)
Pt called to follow up from OV 06/10/15 still c/o multiple boils, never started birth control pills, states unable to remember to take. Pt said she would like to speak TF about other options to help with boils, states since Mirena IUD removal she noticed boils. Will have front desk call to schedule.

## 2016-09-03 ENCOUNTER — Encounter: Payer: Self-pay | Admitting: Gynecology

## 2016-09-03 ENCOUNTER — Ambulatory Visit (INDEPENDENT_AMBULATORY_CARE_PROVIDER_SITE_OTHER): Payer: 59 | Admitting: Gynecology

## 2016-09-03 VITALS — BP 118/76 | Ht 66.0 in | Wt 191.0 lb

## 2016-09-03 DIAGNOSIS — Z01419 Encounter for gynecological examination (general) (routine) without abnormal findings: Secondary | ICD-10-CM

## 2016-09-03 DIAGNOSIS — Z13220 Encounter for screening for lipoid disorders: Secondary | ICD-10-CM | POA: Diagnosis not present

## 2016-09-03 LAB — LIPID PANEL
Cholesterol: 158 mg/dL (ref <200)
HDL: 37 mg/dL — ABNORMAL LOW (ref >50)
LDL CALC: 95 mg/dL (ref <100)
TRIGLYCERIDES: 128 mg/dL (ref <150)
Total CHOL/HDL Ratio: 4.3 Ratio (ref <5.0)
VLDL: 26 mg/dL (ref <30)

## 2016-09-03 LAB — COMPREHENSIVE METABOLIC PANEL
ALBUMIN: 4.1 g/dL (ref 3.6–5.1)
ALT: 25 U/L (ref 6–29)
AST: 19 U/L (ref 10–30)
Alkaline Phosphatase: 76 U/L (ref 33–115)
BILIRUBIN TOTAL: 0.5 mg/dL (ref 0.2–1.2)
BUN: 7 mg/dL (ref 7–25)
CO2: 23 mmol/L (ref 20–31)
CREATININE: 0.75 mg/dL (ref 0.50–1.10)
Calcium: 8.8 mg/dL (ref 8.6–10.2)
Chloride: 102 mmol/L (ref 98–110)
GLUCOSE: 97 mg/dL (ref 65–99)
Potassium: 4.1 mmol/L (ref 3.5–5.3)
SODIUM: 136 mmol/L (ref 135–146)
Total Protein: 6.5 g/dL (ref 6.1–8.1)

## 2016-09-03 LAB — CBC WITH DIFFERENTIAL/PLATELET
Basophils Absolute: 0 cells/uL (ref 0–200)
Basophils Relative: 0 %
EOS ABS: 94 {cells}/uL (ref 15–500)
Eosinophils Relative: 1 %
HEMATOCRIT: 42 % (ref 35.0–45.0)
Hemoglobin: 13.9 g/dL (ref 11.7–15.5)
LYMPHS PCT: 28 %
Lymphs Abs: 2632 cells/uL (ref 850–3900)
MCH: 29.2 pg (ref 27.0–33.0)
MCHC: 33.1 g/dL (ref 32.0–36.0)
MCV: 88.2 fL (ref 80.0–100.0)
MONO ABS: 752 {cells}/uL (ref 200–950)
MPV: 8.9 fL (ref 7.5–12.5)
Monocytes Relative: 8 %
NEUTROS PCT: 63 %
Neutro Abs: 5922 cells/uL (ref 1500–7800)
Platelets: 246 10*3/uL (ref 140–400)
RBC: 4.76 MIL/uL (ref 3.80–5.10)
RDW: 13.4 % (ref 11.0–15.0)
WBC: 9.4 10*3/uL (ref 3.8–10.8)

## 2016-09-03 MED ORDER — NORETHINDRONE ACET-ETHINYL EST 1-20 MG-MCG PO TABS: 1.0000 | ORAL_TABLET | Freq: Every day | ORAL | 11 refills | Status: DC

## 2016-09-03 NOTE — Patient Instructions (Signed)
Call to Schedule your mammogram  Facilities in [place]: 1)  [facility]. [facility], [address] Phone: [phone] 2)  Dr. Bertrand at [facility]  [address] Phone: [phone]     Mammogram A mammogram is an X-ray test to find changes in a woman's breast. You should get a mammogram if:  You are 40 years of age or older  You have risk factors.   Your doctor recommends that you have one.  BEFORE THE TEST  Do not schedule the test the week before your period, especially if your breasts are sore during this time.  On the day of your mammogram:  Wash your breasts and armpits well. After washing, do not put on any deodorant or talcum powder on until after your test.   Eat and drink as you usually do.   Take your medicines as usual.   If you are diabetic and take insulin, make sure you:   Eat before coming for your test.   Take your insulin as usual.   If you cannot keep your appointment, call before the appointment to cancel. Schedule another appointment.  TEST  You will need to undress from the waist up. You will put on a hospital gown.   Your breast will be put on the mammogram machine, and it will press firmly on your breast with a piece of plastic called a compression paddle. This will make your breast flatter so that the machine can X-ray all parts of your breast.   Both breasts will be X-rayed. Each breast will be X-rayed from above and from the side. An X-ray might need to be taken again if the picture is not good enough.   The mammogram will last about 15 to 30 minutes.  AFTER THE TEST Finding out the results of your test Ask when your test results will be ready. Make sure you get your test results.  Document Released: 05/15/2008 Document Revised: 02/05/2011 Document Reviewed: 05/15/2008 [employer] Patient Information 2012 [employer].   

## 2016-09-03 NOTE — Progress Notes (Signed)
    Kathleen West G Few [birth year] [id number]        41 y.o.  [id number] for annual exam.    Past medical history,surgical history, problem list, medications, allergies, family history and social history were all reviewed and documented as reviewed in the EPIC chart.  ROS:  Performed with pertinent positives and negatives included in the history, assessment and plan.   Additional significant findings :  None   Exam: Kathleen West West assistant Vitals:   09/03/16 0801  BP: 118/76  Weight: 191 lb (86.6 kg)  Height: 5\' 6"  (1.676 m)   Body mass index is 30.83 kg/m.  General appearance:  Normal affect, orientation and appearance. Skin: Grossly normal HEENT: Without gross lesions.  No cervical or supraclavicular adenopathy. Thyroid normal.  Lungs:  Clear without wheezing, rales or rhonchi Cardiac: RR, without RMG Abdominal:  Soft, nontender, without masses, guarding, rebound, organomegaly or hernia Breasts:  Examined lying and sitting without masses, retractions, discharge or axillary adenopathy. Pelvic:  Ext, BUS, Vagina: Normal with multiple small evolving boils over mons pubis  Cervix: Normal  Uterus: Anteverted, normal size, shape and contour, midline and mobile nontender   Adnexa: Without masses or tenderness    Anus and perineum: Normal   Rectovaginal: Normal sphincter tone without palpated masses or tenderness.    Assessment/Plan:  [name] for annual exam with regular menses, condom contraception.   1. Multiple small boils. Had prescribed oral contraceptives last year which she ultimately stopped taking because she forgot to take the pill. She does relate having hormone levels checked by another physician a number of years ago and was told that her testosterone level was more dominant which was the cause of her boils. She also notes some hirsutism as far as chin hair. I reviewed various options to include dermatology referral. I recommendation would be to restart low-dose  oral contraceptives which would hopefully decrease ovarian androgen production and lead to resolution of her boils and decreased hair growth. Risks to include thrombosis such as stroke heart attack DVT reviewed. She's never smoked and not being followed for any medical issues. Patient's comfortable on retrying these and being more consistent to take them. Loestrin 1/20 equivalent prescribed 1 year. Follow up if any issues. 2. Contraception. Using condoms but notes rare intercourse. Starting pills as above. 3. Mammogram never. I again recommended she schedule a screening mammogram and she agrees to do so this coming year. SBE monthly reviewed. Breast exam normal today. 4. Pap smear 2017. Pap smear/HPV 2015 but no endocervical cells. 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 5. History of HSV with rare occurrences. Has supply of Valtrex at home but will call when she needs more. 6. Health maintenance. Patient requests baseline labs. CBC, CMP, lipid profile and urinalysis ordered. Follow up in one year, sooner if any issues.   [name],[name] P MD, 8:34 AM 09/03/2016

## 2016-09-04 LAB — URINALYSIS W MICROSCOPIC + REFLEX CULTURE
Bilirubin Urine: NEGATIVE
CASTS: NONE SEEN [LPF]
Crystals: NONE SEEN [HPF]
GLUCOSE, UA: NEGATIVE
HGB URINE DIPSTICK: NEGATIVE
KETONES UR: NEGATIVE
NITRITE: NEGATIVE
PH: 5.5 (ref 5.0–8.0)
Protein, ur: NEGATIVE
Specific Gravity, Urine: 1.022 (ref 1.001–1.035)
Yeast: NONE SEEN [HPF]

## 2016-09-05 LAB — URINE CULTURE: Organism ID, Bacteria: NO GROWTH

## 2016-09-30 DIAGNOSIS — H11153 Pinguecula, bilateral: Secondary | ICD-10-CM | POA: Diagnosis not present

## 2016-09-30 DIAGNOSIS — H35413 Lattice degeneration of retina, bilateral: Secondary | ICD-10-CM | POA: Diagnosis not present

## 2016-09-30 DIAGNOSIS — H43393 Other vitreous opacities, bilateral: Secondary | ICD-10-CM | POA: Diagnosis not present

## 2016-12-15 ENCOUNTER — Encounter: Payer: Self-pay | Admitting: Gynecology

## 2016-12-15 DIAGNOSIS — Z1231 Encounter for screening mammogram for malignant neoplasm of breast: Secondary | ICD-10-CM | POA: Diagnosis not present

## 2017-01-01 DIAGNOSIS — Z23 Encounter for immunization: Secondary | ICD-10-CM | POA: Diagnosis not present

## 2017-02-19 DIAGNOSIS — J45909 Unspecified asthma, uncomplicated: Secondary | ICD-10-CM | POA: Diagnosis not present

## 2017-07-15 DIAGNOSIS — J069 Acute upper respiratory infection, unspecified: Secondary | ICD-10-CM | POA: Diagnosis not present

## 2017-07-19 DIAGNOSIS — J209 Acute bronchitis, unspecified: Secondary | ICD-10-CM | POA: Diagnosis not present

## 2017-12-10 DIAGNOSIS — Z23 Encounter for immunization: Secondary | ICD-10-CM | POA: Diagnosis not present

## 2018-01-09 DIAGNOSIS — J029 Acute pharyngitis, unspecified: Secondary | ICD-10-CM | POA: Diagnosis not present

## 2018-01-09 DIAGNOSIS — J020 Streptococcal pharyngitis: Secondary | ICD-10-CM | POA: Diagnosis not present

## 2018-11-22 ENCOUNTER — Encounter: Payer: Self-pay | Admitting: Gynecology

## 2019-01-01 DIAGNOSIS — A630 Anogenital (venereal) warts: Secondary | ICD-10-CM

## 2019-01-01 HISTORY — DX: Anogenital (venereal) warts: A63.0

## 2019-01-04 ENCOUNTER — Other Ambulatory Visit: Payer: Self-pay

## 2019-01-09 ENCOUNTER — Other Ambulatory Visit: Payer: Self-pay

## 2019-01-09 ENCOUNTER — Ambulatory Visit (INDEPENDENT_AMBULATORY_CARE_PROVIDER_SITE_OTHER): Payer: 59 | Admitting: Gynecology

## 2019-01-09 ENCOUNTER — Encounter: Payer: Self-pay | Admitting: Gynecology

## 2019-01-09 VITALS — BP 124/80 | Ht 66.0 in | Wt 194.0 lb

## 2019-01-09 DIAGNOSIS — Z01419 Encounter for gynecological examination (general) (routine) without abnormal findings: Secondary | ICD-10-CM

## 2019-01-09 DIAGNOSIS — Z1151 Encounter for screening for human papillomavirus (HPV): Secondary | ICD-10-CM | POA: Diagnosis not present

## 2019-01-09 DIAGNOSIS — Z13220 Encounter for screening for lipoid disorders: Secondary | ICD-10-CM

## 2019-01-09 DIAGNOSIS — B3731 Acute candidiasis of vulva and vagina: Secondary | ICD-10-CM

## 2019-01-09 DIAGNOSIS — B373 Candidiasis of vulva and vagina: Secondary | ICD-10-CM | POA: Diagnosis not present

## 2019-01-09 LAB — CBC WITH DIFFERENTIAL/PLATELET
Absolute Monocytes: 711 cells/uL (ref 200–950)
Basophils Absolute: 40 cells/uL (ref 0–200)
Basophils Relative: 0.5 %
Eosinophils Absolute: 87 cells/uL (ref 15–500)
Eosinophils Relative: 1.1 %
HCT: 39.1 % (ref 35.0–45.0)
Hemoglobin: 12.8 g/dL (ref 11.7–15.5)
Lymphs Abs: 1809 cells/uL (ref 850–3900)
MCH: 28.3 pg (ref 27.0–33.0)
MCHC: 32.7 g/dL (ref 32.0–36.0)
MCV: 86.3 fL (ref 80.0–100.0)
MPV: 9.7 fL (ref 7.5–12.5)
Monocytes Relative: 9 %
Neutro Abs: 5254 cells/uL (ref 1500–7800)
Neutrophils Relative %: 66.5 %
Platelets: 287 10*3/uL (ref 140–400)
RBC: 4.53 10*6/uL (ref 3.80–5.10)
RDW: 12.2 % (ref 11.0–15.0)
Total Lymphocyte: 22.9 %
WBC: 7.9 10*3/uL (ref 3.8–10.8)

## 2019-01-09 LAB — COMPREHENSIVE METABOLIC PANEL
AG Ratio: 1.7 (calc) (ref 1.0–2.5)
ALT: 24 U/L (ref 6–29)
AST: 19 U/L (ref 10–30)
Albumin: 3.9 g/dL (ref 3.6–5.1)
Alkaline phosphatase (APISO): 75 U/L (ref 31–125)
BUN: 7 mg/dL (ref 7–25)
CO2: 24 mmol/L (ref 20–32)
Calcium: 8.9 mg/dL (ref 8.6–10.2)
Chloride: 104 mmol/L (ref 98–110)
Creat: 0.76 mg/dL (ref 0.50–1.10)
Globulin: 2.3 g/dL (calc) (ref 1.9–3.7)
Glucose, Bld: 106 mg/dL — ABNORMAL HIGH (ref 65–99)
Potassium: 4.2 mmol/L (ref 3.5–5.3)
Sodium: 138 mmol/L (ref 135–146)
Total Bilirubin: 0.5 mg/dL (ref 0.2–1.2)
Total Protein: 6.2 g/dL (ref 6.1–8.1)

## 2019-01-09 LAB — LIPID PANEL
Cholesterol: 159 mg/dL (ref <200)
HDL: 38 mg/dL — ABNORMAL LOW (ref >=50)
LDL Cholesterol (Calc): 99 mg/dL (calc)
Non-HDL Cholesterol (Calc): 121 mg/dL (calc) (ref <130)
Total CHOL/HDL Ratio: 4.2 (calc) (ref <5.0)
Triglycerides: 120 mg/dL (ref <150)

## 2019-01-09 MED ORDER — NORETHINDRONE ACET-ETHINYL EST 1-20 MG-MCG PO TABS: 1.0000 | ORAL_TABLET | Freq: Every day | ORAL | 4 refills | Status: DC

## 2019-01-09 MED ORDER — FLUCONAZOLE 150 MG PO TABS: 150.0000 mg | ORAL_TABLET | Freq: Once | ORAL | 0 refills | Status: AC

## 2019-01-09 NOTE — Addendum Note (Signed)
Addended by: Nelva Nay on: 01/09/2019 09:37 AM   Modules accepted: Orders

## 2019-01-09 NOTE — Progress Notes (Signed)
    Kathleen West 1975-05-06 [id number]        43 y.o.  [id number] for annual gynecologic exam.  Several issues noted below.  Past medical history,surgical history, problem list, medications, allergies, family history and social history were all reviewed and documented as reviewed in the EPIC chart.  ROS:  Performed with pertinent positives and negatives included in the history, assessment and plan.   Additional significant findings : None   Exam: Caryn Bee assistant Vitals:   01/09/19 0839  BP: 124/80  Weight: 194 lb (88 kg)  Height: 5\' 6"  (1.676 m)   Body mass index is 31.31 kg/m.  General appearance:  Normal affect, orientation and appearance. Skin: Grossly normal HEENT: Without gross lesions.  No cervical or supraclavicular adenopathy. Thyroid normal.  Lungs:  Clear without wheezing, rales or rhonchi Cardiac: RR, without RMG Abdominal:  Soft, nontender, without masses, guarding, rebound, organomegaly or hernia Breasts:  Examined lying and sitting without masses, retractions, discharge or axillary adenopathy. Pelvic:  Ext, BUS, Vagina: With cottage cheese discharge.    Cervix: Normal.  Pap smear/HPV  Uterus: Anteverted, normal size, shape and contour, midline and mobile nontender   Adnexa: Without masses or tenderness    Anus and perineum: Normal   Rectovaginal: Normal sphincter tone without palpated masses or tenderness.    Assessment/Plan:  43 y.o. [id number] female for annual gynecologic exam.  With regular menses, not sexually active  1. 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  We had discussed this previously and she was to start on low-dose oral contraceptives but ultimately did not take them.  Also had a trial of doxycycline through her dermatologist but did not feel well taking the doxycycline.  We again discussed strategies to address this to include over-the-counter cleansing products, low-dose oral contraceptives for ovarian androgen suppression  and low-dose continuous antibiotics.  Ultimately we have decided to start low-dose oral contraceptives.  We discussed the risks to include thrombosis such as stroke heart attack DVT.  Never smoked and not being followed for any medical issues.  Loestrin 1/20 equivalent prescribed.  We will follow-up in several months of continuing to have issues. 2. Yeastlike discharge.  Patient notes itching also.  Will treat with Diflucan 150 mg x 1 dose.  Follow-up if symptoms persist. 3. Mammography 2018.  Recommend mammogram now and she agrees to call and schedule.  Breast exam normal today. 4. Pap smear 2017.  Pap smear/HPV today.  History LGSIL 2007 and 2008 with normal Pap smears since. 5. History of HSV.  Not an issue at this time.  Will call if needs Valtrex. 6. Health maintenance.  CBC, CMP and lipid profile ordered.  Follow-up 1 year, sooner as needed.   Anastasio Auerbach MD, 9:10 AM 01/09/2019

## 2019-01-09 NOTE — Patient Instructions (Signed)
Take the 1 Diflucan pill to treat the yeast infection  Start on the oral contraceptives as we discussed to hopefully suppress boil formation.  Follow-up if this continues to be an issue after several months.  Schedule your mammogram  Follow-up in 1 year for annual exam

## 2019-01-12 ENCOUNTER — Other Ambulatory Visit: Payer: Self-pay | Admitting: *Deleted

## 2019-01-14 LAB — HPV TYPE 16 AND 18/45 RNA
HPV Type 16 RNA: NOT DETECTED
HPV Type 18/45 RNA: NOT DETECTED

## 2019-01-14 LAB — PAP IG AND HPV HIGH-RISK: HPV DNA High Risk: DETECTED — AB

## 2019-01-16 ENCOUNTER — Encounter: Payer: Self-pay | Admitting: Gynecology

## 2020-07-11 ENCOUNTER — Other Ambulatory Visit: Payer: Self-pay

## 2020-12-31 HISTORY — PX: OTHER SURGICAL HISTORY: SHX169

## 2021-01-30 DIAGNOSIS — S62102A Fracture of unspecified carpal bone, left wrist, initial encounter for closed fracture: Secondary | ICD-10-CM

## 2021-01-30 HISTORY — DX: Fracture of unspecified carpal bone, left wrist, initial encounter for closed fracture: S62.102A

## 2021-02-03 ENCOUNTER — Telehealth: Payer: Self-pay | Admitting: Family Medicine

## 2021-02-03 NOTE — Telephone Encounter (Signed)
Spoke to patient. Gave Dr Amanda Pea info. Patient is going to contact their office to schedule.

## 2021-02-03 NOTE — Telephone Encounter (Signed)
Patient is currently in [place] at [employer] there. She fell in the parking lot and hurt her wrist so she had one of their sports med people look at it and do xrays. They told her it was broken and she might need surgery. She wanted to come here to see [place] but I told her if surgery was needed, it might be best to just go to Ortho. She asked if Dr Katrinka Blazing could look at her xrays to see if it looked like surgery would be the best option. She will be leaving [employer] and flying to [place] and will not be back home until Friday.  Please advise.  (Xray pictures have been sent to [facility]. Working on uploading to her chart.)

## 2021-02-03 NOTE — Telephone Encounter (Signed)
Per a verbal from Dr. Katrinka Blazing, patient needs to f/u with ortho.

## 2021-02-07 ENCOUNTER — Encounter (HOSPITAL_BASED_OUTPATIENT_CLINIC_OR_DEPARTMENT_OTHER): Payer: Self-pay | Admitting: Orthopedic Surgery

## 2021-02-07 ENCOUNTER — Other Ambulatory Visit: Payer: Self-pay

## 2021-02-07 NOTE — Progress Notes (Addendum)
Spoke w/ via phone for pre-op interview---pt Lab needs dos----  urine poct per anesthesia surgery  orders pending             Lab results------none COVID test -----patient states asymptomatic no test needed Arrive at -------640 am 02-13-2021 NPO after MN NO Solid Food.  Clear liquids from MN until---540 am Med rec completed Medications to take morning of surgery -----none Diabetic medication ----- Patient instructed no nail polish to be worn day of surgery Patient instructed to bring photo id and insurance card day of surgery Patient aware to have Driver (ride ) / caregiver    for 24 hours after surgery  mother or spouse Kathleen West Patient Special Instructions -----none Pre-Op special Istructions -----none Patient verbalized understanding of instructions that were given at this phone interview. Patient denies shortness of breath, chest pain, fever, cough at this phone interview.   Addendum: spoke with pt by phone and reviewed pre op instructions , pt vocalized understanding

## 2021-02-10 NOTE — H&P (Signed)
Preoperative History & Physical Exam  Surgeon: Philipp Ovens, MD  Diagnosis: Left wrist fracture  Planned Procedure: Procedure(s) (LRB): OPEN REDUCTION INTERNAL FIXATION (ORIF) WRIST FRACTURE (Left)  History of Present Illness:   Patient is a 45 y.o. female with symptoms consistent with  Left wrist fracture who presents for surgical intervention. The risks, benefits and alternatives of surgical intervention were discussed and informed consent was obtained prior to surgery.  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   Pap smear with normal cytology, positive high risk HPV, negative subtype 16, 18/45   Hx gestational diabetes 2004   late 2004 none since   Left wrist fracture 01/30/2021   LGSIL (low grade squamous intraepithelial dysplasia)    2007, 2008, negative since 2009   Wears glasses    or contacts   Wears partial dentures    1 upper tooth "flipper per pt"    Past Surgical History:  Past Surgical History:  Procedure Laterality Date   colonscopy  12/2020   f/u in 5 years   DILATION AND CURETTAGE OF UTERUS     16 yrs ago per pt on 02-07-2021    Medications:  Prior to Admission medications   Medication Sig Start Date End Date Taking? Authorizing Provider  acetaminophen (TYLENOL) 325 MG tablet Take 325 mg by mouth every 6 (six) hours as needed.   Yes [provider]  acetaminophen (TYLENOL) 500 MG tablet Take 1,000 mg by mouth every 6 (six) hours as needed.   Yes [provider]  ibuprofen (ADVIL) 200 MG tablet Take 800 mg by mouth every 6 (six) hours as needed.   Yes [provider]    Allergies:  Codeine and Doxycycline  Review of Systems: Negative except per HPI.  Physical Exam: Alert and oriented, NAD Head and neck: no masses, normal alignment CV: pulse intact Pulm: no increased  work of breathing, respirations even and unlabored Abdomen: non-distended Extremities: extremities warm and well perfused  LABS: No results found for this or any previous visit (from the past 2160 hour(s)).   Complete History and Physical exam available in the office notes  Gomez Cleverly

## 2021-02-13 ENCOUNTER — Encounter (HOSPITAL_BASED_OUTPATIENT_CLINIC_OR_DEPARTMENT_OTHER): Payer: Self-pay | Admitting: Orthopedic Surgery

## 2021-02-13 ENCOUNTER — Ambulatory Visit (HOSPITAL_BASED_OUTPATIENT_CLINIC_OR_DEPARTMENT_OTHER)
Admission: RE | Admit: 2021-02-13 | Discharge: 2021-02-13 | Disposition: A | Discharge status: Home / Self Care | Payer: 59 | Attending: Orthopedic Surgery | Admitting: Orthopedic Surgery

## 2021-02-13 ENCOUNTER — Ambulatory Visit (HOSPITAL_BASED_OUTPATIENT_CLINIC_OR_DEPARTMENT_OTHER): Payer: 59

## 2021-02-13 ENCOUNTER — Other Ambulatory Visit: Payer: Self-pay

## 2021-02-13 ENCOUNTER — Ambulatory Visit (HOSPITAL_BASED_OUTPATIENT_CLINIC_OR_DEPARTMENT_OTHER): Payer: 59 | Admitting: Anesthesiology

## 2021-02-13 ENCOUNTER — Encounter (HOSPITAL_BASED_OUTPATIENT_CLINIC_OR_DEPARTMENT_OTHER)
Admission: RE | Disposition: A | Discharge status: Home / Self Care | Payer: Self-pay | Source: Home / Self Care | Attending: Orthopedic Surgery

## 2021-02-13 DIAGNOSIS — S52502A Unspecified fracture of the lower end of left radius, initial encounter for closed fracture: Secondary | ICD-10-CM | POA: Insufficient documentation

## 2021-02-13 DIAGNOSIS — X58XXXA Exposure to other specified factors, initial encounter: Secondary | ICD-10-CM | POA: Insufficient documentation

## 2021-02-13 DIAGNOSIS — S62102A Fracture of unspecified carpal bone, left wrist, initial encounter for closed fracture: Secondary | ICD-10-CM

## 2021-02-13 HISTORY — DX: Presence of dental prosthetic device (complete) (partial): Z97.2

## 2021-02-13 HISTORY — DX: Presence of spectacles and contact lenses: Z97.3

## 2021-02-13 HISTORY — PX: ORIF WRIST FRACTURE: SHX2133

## 2021-02-13 HISTORY — DX: Cardiac murmur, unspecified: R01.1

## 2021-02-13 LAB — POCT PREGNANCY, URINE: Preg Test, Ur: NEGATIVE

## 2021-02-13 SURGERY — OPEN REDUCTION INTERNAL FIXATION (ORIF) WRIST FRACTURE
Anesthesia: Monitor Anesthesia Care | Site: Wrist | Laterality: Left

## 2021-02-13 MED ORDER — CEFAZOLIN SODIUM-DEXTROSE 2-4 GM/100ML-% IV SOLN
INTRAVENOUS | Status: AC
  Filled 2021-02-13: qty 100

## 2021-02-13 MED ORDER — PROPOFOL 500 MG/50ML IV EMUL
INTRAVENOUS | Status: DC | PRN
  Administered 2021-02-13: 125 ug/kg/min via INTRAVENOUS

## 2021-02-13 MED ORDER — MIDAZOLAM HCL 2 MG/2ML IJ SOLN
INTRAMUSCULAR | Status: AC
  Filled 2021-02-13: qty 2

## 2021-02-13 MED ORDER — FENTANYL CITRATE (PF) 100 MCG/2ML IJ SOLN
INTRAMUSCULAR | Status: AC
  Filled 2021-02-13: qty 2

## 2021-02-13 MED ORDER — CEFAZOLIN SODIUM-DEXTROSE 2-4 GM/100ML-% IV SOLN
2.0000 g | INTRAVENOUS | Status: AC
  Administered 2021-02-13: 2 g via INTRAVENOUS

## 2021-02-13 MED ORDER — ONDANSETRON HCL 4 MG/2ML IJ SOLN
INTRAMUSCULAR | Status: AC
  Filled 2021-02-13: qty 2

## 2021-02-13 MED ORDER — FENTANYL CITRATE (PF) 100 MCG/2ML IJ SOLN
100.0000 ug | Freq: Once | INTRAMUSCULAR | Status: AC
  Administered 2021-02-13: 100 ug via INTRAVENOUS

## 2021-02-13 MED ORDER — MIDAZOLAM HCL 2 MG/2ML IJ SOLN
2.0000 mg | Freq: Once | INTRAMUSCULAR | Status: AC
  Administered 2021-02-13: 2 mg via INTRAVENOUS

## 2021-02-13 MED ORDER — SODIUM CHLORIDE 0.9 % IR SOLN
Status: DC | PRN
  Administered 2021-02-13: 1000 mL

## 2021-02-13 MED ORDER — ONDANSETRON HCL 4 MG/2ML IJ SOLN
INTRAMUSCULAR | Status: DC | PRN
  Administered 2021-02-13: 4 mg via INTRAVENOUS

## 2021-02-13 MED ORDER — LACTATED RINGERS IV SOLN: INTRAVENOUS | Status: DC

## 2021-02-13 MED ORDER — ROPIVACAINE HCL 5 MG/ML IJ SOLN
INTRAMUSCULAR | Status: DC | PRN
  Administered 2021-02-13: 30 mL via PERINEURAL

## 2021-02-13 MED ORDER — DEXAMETHASONE SODIUM PHOSPHATE 10 MG/ML IJ SOLN
INTRAMUSCULAR | Status: DC | PRN
  Administered 2021-02-13: 5 mg

## 2021-02-13 MED ORDER — OXYCODONE-ACETAMINOPHEN 5-325 MG PO TABS: 1.0000 | ORAL_TABLET | Freq: Four times a day (QID) | ORAL | 0 refills | Status: AC | PRN

## 2021-02-13 MED ORDER — PROPOFOL 1000 MG/100ML IV EMUL
INTRAVENOUS | Status: AC
  Filled 2021-02-13: qty 100

## 2021-02-13 SURGICAL SUPPLY — 73 items
BIT DRILL 2.2 SS TIBIAL (BIT) ×1 IMPLANT
BLADE SURG 15 STRL LF DISP TIS (BLADE) ×2 IMPLANT
BLADE SURG 15 STRL SS (BLADE) ×4
BNDG CMPR 9X4 STRL LF SNTH (GAUZE/BANDAGES/DRESSINGS) ×1
BNDG ELASTIC 4X5.8 VLCR STR LF (GAUZE/BANDAGES/DRESSINGS) ×2 IMPLANT
BNDG ESMARK 4X9 LF (GAUZE/BANDAGES/DRESSINGS) ×2 IMPLANT
BNDG PLASTER X FAST 3X3 WHT LF (CAST SUPPLIES) ×1 IMPLANT
BNDG PLSTR 9X3 FST ST WHT (CAST SUPPLIES) ×1
CORD BIPOLAR FORCEPS 12FT (ELECTRODE) ×2 IMPLANT
COVER BACK TABLE 60X90IN (DRAPES) ×2 IMPLANT
CUFF TOURN SGL QUICK 18X4 (TOURNIQUET CUFF) ×1 IMPLANT
CUFF TOURN SGL QUICK 24 (TOURNIQUET CUFF)
CUFF TRNQT CYL 24X4X16.5-23 (TOURNIQUET CUFF) IMPLANT
DECANTER SPIKE VIAL GLASS SM (MISCELLANEOUS) IMPLANT
DRAPE EXTREMITY T 121X128X90 (DISPOSABLE) ×2 IMPLANT
DRAPE OEC MINIVIEW 54X84 (DRAPES) ×2 IMPLANT
DRAPE SHEET LG 3/4 BI-LAMINATE (DRAPES) ×2 IMPLANT
DRAPE STERI URO 23X35 APER SZ5 (DRAPES) ×1 IMPLANT
DRAPE SURG 17X23 STRL (DRAPES) IMPLANT
GAUZE 4X4 16PLY ~~LOC~~+RFID DBL (SPONGE) ×2 IMPLANT
GAUZE SPONGE 4X4 12PLY STRL (GAUZE/BANDAGES/DRESSINGS) ×2 IMPLANT
GAUZE SPONGE 4X4 12PLY STRL LF (GAUZE/BANDAGES/DRESSINGS) ×1 IMPLANT
GAUZE XEROFORM 1X8 LF (GAUZE/BANDAGES/DRESSINGS) ×2 IMPLANT
GLOVE SURG ENC MOIS LTX SZ7.5 (GLOVE) ×2 IMPLANT
GLOVE SURG UNDER POLY LF SZ7.5 (GLOVE) ×2 IMPLANT
GOWN STRL REUS W/TWL LRG LVL3 (GOWN DISPOSABLE) ×4 IMPLANT
HIBICLENS CHG 4% 4OZ BTL (MISCELLANEOUS) ×2 IMPLANT
K-WIRE 1.6 (WIRE) ×4
K-WIRE DBL END TROCAR 6X.045 (WIRE)
K-WIRE DBL END TROCAR 6X.062 (WIRE)
K-WIRE FX5X1.6XNS BN SS (WIRE) ×2
KIT TURNOVER CYSTO (KITS) ×2 IMPLANT
KWIRE DBL END TROCAR 6X.045 (WIRE) IMPLANT
KWIRE DBL END TROCAR 6X.062 (WIRE) IMPLANT
KWIRE FX5X1.6XNS BN SS (WIRE) IMPLANT
NDL HYPO 25X1 1.5 SAFETY (NEEDLE) IMPLANT
NEEDLE HYPO 25X1 1.5 SAFETY (NEEDLE) IMPLANT
NS IRRIG 1000ML POUR BTL (IV SOLUTION) ×2 IMPLANT
PACK BASIN DAY SURGERY FS (CUSTOM PROCEDURE TRAY) ×2 IMPLANT
PAD CAST 4YDX4 CTTN HI CHSV (CAST SUPPLIES) ×1 IMPLANT
PADDING CAST ABS 4INX4YD NS (CAST SUPPLIES) ×1
PADDING CAST ABS COTTON 4X4 ST (CAST SUPPLIES) ×1 IMPLANT
PADDING CAST COTTON 4X4 STRL (CAST SUPPLIES) ×2
PEG LOCKING SMOOTH 2.2X14 (Peg) ×2 IMPLANT
PEG LOCKING SMOOTH 2.2X16 (Screw) ×2 IMPLANT
PEG LOCKING SMOOTH 2.2X20 (Screw) ×2 IMPLANT
PLATE STANDARD DVR LEFT (Plate) ×2 IMPLANT
PLATE STD DVR LT 24X51 (Plate) IMPLANT
SCREW  LP NL 2.7X15MM (Screw) ×2 IMPLANT
SCREW 2.7X14MM (Screw) ×1 IMPLANT
SCREW BN 14X2.7XNONLOCK 3 LD (Screw) IMPLANT
SCREW LOCK 10X2.7X3 LD THRD (Screw) IMPLANT
SCREW LOCK 14X2.7X 3 LD TPR (Screw) IMPLANT
SCREW LOCKING 2.7X10MM (Screw) ×2 IMPLANT
SCREW LOCKING 2.7X14 (Screw) ×2 IMPLANT
SCREW LP NL 2.7X15MM (Screw) IMPLANT
SCREW MULTI DIRECTIONAL 2.7X16 (Screw) ×1 IMPLANT
SCREW NLOCK 2.7X14 (Screw) ×1 IMPLANT
SLEEVE SCD COMPRESS KNEE MED (STOCKING) IMPLANT
SLING ARM FOAM STRAP MED (SOFTGOODS) ×1 IMPLANT
SPLINT PLASTER CAST XFAST 3X15 (CAST SUPPLIES) IMPLANT
SPLINT PLASTER XTRA FASTSET 3X (CAST SUPPLIES) ×1
SPONGE T-LAP 4X18 ~~LOC~~+RFID (SPONGE) ×2 IMPLANT
STRIP CLOSURE SKIN 1/2X4 (GAUZE/BANDAGES/DRESSINGS) ×2 IMPLANT
SUCTION FRAZIER HANDLE 10FR (MISCELLANEOUS) ×2
SUCTION TUBE FRAZIER 10FR DISP (MISCELLANEOUS) IMPLANT
SUT ETHILON 4 0 PS 2 18 (SUTURE) ×2 IMPLANT
SYR 10ML LL (SYRINGE) IMPLANT
SYR BULB EAR ULCER 3OZ GRN STR (SYRINGE) ×2 IMPLANT
TOWEL OR 17X26 10 PK STRL BLUE (TOWEL DISPOSABLE) ×2 IMPLANT
TRAY DSU PREP LF (CUSTOM PROCEDURE TRAY) ×2 IMPLANT
TUBE CONNECTING 12X1/4 (SUCTIONS) IMPLANT
UNDERPAD 30X36 HEAVY ABSORB (UNDERPADS AND DIAPERS) ×2 IMPLANT

## 2021-02-13 NOTE — Anesthesia Preprocedure Evaluation (Signed)
Anesthesia Evaluation  Patient identified by MRN, date of birth, ID band Patient awake    Reviewed: Allergy & Precautions, NPO status , Patient's Chart, lab work & pertinent test results  History of Anesthesia Complications Negative for: history of anesthetic complications  Airway Mallampati: III  TM Distance: >3 FB Neck ROM: Full    Dental  (+) Partial Upper   Pulmonary neg pulmonary ROS,    Pulmonary exam normal        Cardiovascular negative cardio ROS Normal cardiovascular exam     Neuro/Psych negative neurological ROS     GI/Hepatic negative GI ROS, Neg liver ROS,   Endo/Other  negative endocrine ROS  Renal/GU negative Renal ROS  negative genitourinary   Musculoskeletal negative musculoskeletal ROS (+)   Abdominal   Peds  Hematology negative hematology ROS (+)   Anesthesia Other Findings   Reproductive/Obstetrics                            Anesthesia Physical Anesthesia Plan  ASA: 2  Anesthesia Plan: MAC and Regional   Post-op Pain Management: Regional block   Induction: Intravenous  PONV Risk Score and Plan: 2 and Propofol infusion, TIVA and Treatment may vary due to age or medical condition  Airway Management Planned: Natural Airway, Nasal Cannula and Simple Face Mask  Additional Equipment: None  Intra-op Plan:   Post-operative Plan:   Informed Consent: I have reviewed the patients History and Physical, chart, labs and discussed the procedure including the risks, benefits and alternatives for the proposed anesthesia with the patient or authorized representative who has indicated his/her understanding and acceptance.       Plan Discussed with:   Anesthesia Plan Comments:        Anesthesia Quick Evaluation

## 2021-02-13 NOTE — Anesthesia Postprocedure Evaluation (Signed)
Anesthesia Post Note  Patient: Kathleen West  Procedure(s) Performed: OPEN REDUCTION INTERNAL FIXATION (ORIF) WRIST FRACTURE (Left: Wrist)     Patient location during evaluation: PACU Anesthesia Type: Regional Level of consciousness: awake and alert Pain management: pain level controlled Vital Signs Assessment: post-procedure vital signs reviewed and stable Respiratory status: spontaneous breathing, nonlabored ventilation and respiratory function stable Cardiovascular status: blood pressure returned to baseline and stable Postop Assessment: no apparent nausea or vomiting Anesthetic complications: no   No notable events documented.  Last Vitals:  Vitals:   02/13/21 0820 02/13/21 1015  BP: (!) 146/96 120/83  Pulse: 88 82  Resp: 15 16  Temp:  36.9 C  SpO2: 99% 95%    Last Pain:  Vitals:   02/13/21 1015  TempSrc:   PainSc: 0-No pain                 Lucretia Kern

## 2021-02-13 NOTE — Transfer of Care (Signed)
Immediate Anesthesia Transfer of Care Note  Patient: Kathleen West  Procedure(s) Performed: OPEN REDUCTION INTERNAL FIXATION (ORIF) WRIST FRACTURE (Left: Wrist)  Patient Location: PACU  Anesthesia Type:MAC and Regional  Level of Consciousness: awake, alert , oriented and patient cooperative  Airway & Oxygen Therapy: Patient Spontanous Breathing  Post-op Assessment: Report given to RN and Post -op Vital signs reviewed and stable  Post vital signs: Reviewed and stable  Last Vitals:  Vitals Value Taken Time  BP    Temp    Pulse    Resp    SpO2      Last Pain:  Vitals:   02/13/21 0654  TempSrc: Oral  PainSc: 2       Patients Stated Pain Goal: 8 (12/03/47 6116)  Complications: No notable events documented.

## 2021-02-13 NOTE — Anesthesia Procedure Notes (Signed)
Anesthesia Regional Block: Supraclavicular block   Pre-Anesthetic Checklist: , timeout performed,  Correct Patient, Correct Site, Correct Laterality,  Correct Procedure, Correct Position, site marked,  Risks and benefits discussed,  Surgical consent,  Pre-op evaluation,  At surgeon's request and post-op pain management  Laterality: Left  Prep: chloraprep       Needles:  Injection technique: Single-shot  Needle Type: Echogenic Stimulator Needle     Needle Length: 10cm  Needle Gauge: 20     Additional Needles:   Procedures:,,,, ultrasound used (permanent image in chart),,    Narrative:  Start time: 02/13/2021 8:13 AM End time: 02/13/2021 8:17 AM Injection made incrementally with aspirations every 5 mL.  Performed by: Personally  Anesthesiologist: Lucretia Kern, MD  Additional Notes: Standard monitors applied. Skin prepped. Good needle visualization with ultrasound. Injection made in 5cc increments with no resistance to injection. Patient tolerated the procedure well.

## 2021-02-13 NOTE — Op Note (Signed)
OPERATIVE NOTE  DATE OF PROCEDURE: 02/13/2021  SURGEONS: Primary: Orene Desanctis, MD  PREOPERATIVE DIAGNOSIS: Left wrist fracture  POSTOPERATIVE DIAGNOSIS: Same  NAME OF PROCEDURE:    LEFT distal radius open reduction internal fixation, 4 fragments 2.    LEFT wrist brachioradialis tenotomy  3.    LEFT wrist radiographs four views with intraoperative interpretation   ANESTHESIA: Regional Block + MAC  SKIN PREPARATION: Hibiclens  ESTIMATED BLOOD LOSS: Minimal  IMPLANTS: Biomet DVR Crosslock volar plate and screws  Implant Name Type Inv. Item Serial No. Manufacturer Lot No. LRB No. Used Action  PLATE STANDARD DVR LEFT - [id number] Plate PLATE STANDARD DVR LEFT  [employer]ON(ORTH,TRAU,BIO,SG)  Left 1 Implanted  SCREW 2.7X14MM - [id number] Screw SCREW 2.7X14MM  [employer](ORTH,TRAU,BIO,SG)  Left 1 Implanted  PEG LOCKING SMOOTH 2.2X20 - [id number] Screw PEG LOCKING SMOOTH 2.2X20  [employer]ON(ORTH,TRAU,BIO,SG)  Left 2 Implanted  PEG LOCKING SMOOTH 2.2X16 - [id number] Screw PEG LOCKING SMOOTH 2.2X16  [employer]ON(ORTH,TRAU,BIO,SG)  Left 1 Implanted  PEG LOCKING SMOOTH 2.2X14 - [id number] Peg PEG LOCKING SMOOTH 2.2X14  ZIMMER [employer]ON(ORTH,TRAU,BIO,SG)  Left 1 Implanted  SCREW  LP NL 2.7X15MM - [id number] Screw SCREW  LP NL 2.7X15MM  [employer]ON(ORTH,TRAU,BIO,SG)  Left 1 Implanted  SCREW LOCKING 2.7X14 - [id number] Screw SCREW LOCKING 2.7X14  [employer]ON(ORTH,TRAU,BIO,SG)  Left 1 Implanted  PEG LOCKING SMOOTH 2.2X14 - [id number] Peg PEG LOCKING SMOOTH 2.2X14  [employer]ON(ORTH,TRAU,BIO,SG)  Left 1 Implanted  PEG LOCKING SMOOTH 2.2X16 - [id number] Screw PEG LOCKING SMOOTH 2.2X16  [employer]ON(ORTH,TRAU,BIO,SG)  Left 1 Implanted    INDICATIONS:  Carlicia is a 45 y.o. female who has the above preoperative diagnosis. The patient has decided to proceed with surgical intervention.  Risks, benefits and alternatives of operative management were discussed including, but not limited to, risks of anesthesia  complications, infection, pain, persistent symptoms, stiffness, need for future surgery.  The patient understands, agrees and elects to proceed with surgery.    DESCRIPTION OF PROCEDURE: The patient was met in the pre-operative area and their identity was verified.  The operative location and laterality was also verified and marked.  The patient was brought to the OR and was placed supine on the table.  After repeat patient identification with the operative team anesthesia was provided and the patient was prepped and draped in the usual sterile fashion.  A final timeout was performed verifying the correction patient, procedure, location and laterality.  Preoperative antibiotics were administered. The LEFT upper extremity was exsanguinated with an Esmarch and tourniquet inflated to 211mHg. Under loupe magnification, an incision was made directly over the flexor carpi radialis (FCR) tendon. Bipolar was utilized for hemostasis. The roof of the FCR tendon sheath was incised. The FCR tendon was then retracted ulnarly to protect the palmar cutaneous branch of the median nerve. Subsequently, the floor of the FCR tendon sheath was incised over the distal end of the radius.  The flexor pollicis longus (FPL) was swept ulnarly to reveal the pronator quadratus.  The pronator quadratus fascia was incised from its distal and radial borders. A periosteal elevator was utilized to mobilize the pronator quadratus muscle off the distal radius.  The fracture site was irrigated and prepared for reduction with a freer and adson forceps. There were 4 distal radius fracture fragments. The brachioradialis tendon insertion was released to facilitate reduction.  This release was performed by identifying the broad insertion of the brachiradialis tendon and also identifying the 1st dorsal compartment tendons.  The 1st dorsal compartment tendons  were protected, the broad tendon insertion was released under direct visualization. The fracture  was reduced and provisionally fixed with K-wires. We then selected a proper length and width volar plate.  The plate was placed on the distal end of the radius with the fracture reduced and secured to the bone. Using mini C-arm the fracture reduction and position of the plate were deemed to be satisfactory.  We proceeded with securing the plate to the radius with the 1 bicortical nonlocking screw in the oblong portion of the shaft.  With the intermediate column reduced, we secured the distal end of the plate with 2 screws in the distal ulnar portion of plate.  We again used the C-arm to verify satisfactory plate position as well as fracture reduction. The radial column was then reduced and radial styloid locking screws were placed. The remainder shaft screws were placed through the plate. We used the mini C-arm to verify satisfactory plate position, screw lengths and fracture reduction. The DRUJ was then tested in neutral, pronation and supination and was found to be stable.The tourniquet was deflated. Meticulous hemostasis was obtained. The incision was copiously irrigated with normal saline and closed with interrupted 4-0 nylon horizontal mattress sutures. The incision was covered with xeroform, sterile guaze, webril and well padded short arm splint. The fingers were pink, warm and well perfused. All counts were correct. The patient was awoken from anesthesia and brought to PACU for recovery in stable condition.   Matt Holmes, MD Orthopaedic Hand Surgery

## 2021-02-13 NOTE — Interval H&P Note (Signed)
History and Physical Interval Note:  02/13/2021 8:16 AM  Kathleen West  has presented today for surgery, with the diagnosis of Left wrist fracture.  The various methods of treatment have been discussed with the patient and family. After consideration of risks, benefits and other options for treatment, the patient has consented to  Procedure(s) with comments: OPEN REDUCTION INTERNAL FIXATION (ORIF) WRIST FRACTURE (Left) - with MAC as a surgical intervention.  The patient's history has been reviewed, patient examined, no change in status, stable for surgery.  I have reviewed the patient's chart and labs.  Questions were answered to the patient's satisfaction.     Orene Desanctis

## 2021-02-13 NOTE — Progress Notes (Addendum)
Assisted Dr. Witman with left, ultrasound guided, supraclavicular block. Side rails up, monitors on throughout procedure. See vital signs in flow sheet. Tolerated Procedure well. °

## 2021-02-13 NOTE — Discharge Instructions (Addendum)
Orthopaedic Hand Surgery Discharge Instructions  WEIGHT BEARING STATUS: Non weight bearing on operative extremity  DRESSINGS: Please keep your dressing/splint/cast clean and dry until your follow-up appointment. You may shower by placing a waterproof covering over your dressing/splint/cast. Contact your surgeon if your splint/cast gets wet. It will need to be changed to prevent skin breakdown.  PAIN CONTROL: First line medications for post operative pain control are Tylenol (acetaminophen) and Motrin (ibuprofen) if you are able to take these medications. If you have been prescribed a medication these can be taken as breakthrough pain medications. Please note that some narcotic pain medication have acetaminophen added and you should never consume more than 4,000mg  of acetaminophen in 24 hour period. Also please note that if you are given Toradol (ketoralac) you should not take similar medications simultaneously such as ibuprofen.   ICE/ELEVATION: Ice and elevate your injured extremity as needed. Avoid direct contact of ice with skin.  HOME MEDICATIONS: No changes have been made to your home medications.  FOLLOW UP: You will be called after surgery with an appointment date and time, however if you have not received a phone call within 3 days please call during regular office hours at [phone] to schedule a post operative appointment.  Please Seek Medical Attention if: Call MD for: pain or pressure in chest, jaw, arm, back, neck  Call MD for: temperature greater than 101 F for more than 24 hours  Call MD for: difficulty breathing Call MD for: Incision redness, bleeding, drainage  Call MD for: palpitations or feeling that the heart is racing  Call MD for: increased swelling in arm, leg, ankle, or abdomen  Call MD for: lightheadedness, dizziness, fainting Go to ED or call 911 if: chest pain does not go away after 3 nitroglycerin doses taken 5 min apart  Go to ED or call 911 for: any  uncontrolled bleeding  Go to ED or call 911 if: unable to reach physician  Discharge Medications: Allergies as of 02/13/2021       Reactions   Codeine Nausea Only   Doxycycline Other (See Comments)   Dizziness        Medication List     STOP taking these medications    acetaminophen 325 MG tablet Commonly known as: TYLENOL   acetaminophen 500 MG tablet Commonly known as: TYLENOL   ibuprofen 200 MG tablet Commonly known as: ADVIL       TAKE these medications    oxyCODONE-acetaminophen 5-325 MG tablet Commonly known as: Percocet Take 1 tablet by mouth every 6 (six) hours as needed for up to 5 days for severe pain.          Mathis Dad, MD Orthopaedic Hand Surgeon [facility] Office number: [phone] [address] [place], [place] [address]     Regional Anesthesia Blocks  1. Numbness or the inability to move the "blocked" extremity may last from 3-48 hours after placement. The length of time depends on the medication injected and your individual response to the medication. If the numbness is not going away after 48 hours, call your surgeon.  2. The extremity that is blocked will need to be protected until the numbness is gone and the  Strength has returned. Because you cannot feel it, you will need to take extra care to avoid injury. Because it may be weak, you may have difficulty moving it or using it. You may not know what position it is in without looking at it while the block is in  effect.  3. For blocks in the legs and feet, returning to weight bearing and walking needs to be done carefully. You will need to wait until the numbness is entirely gone and the strength has returned. You should be able to move your leg and foot normally before you try and bear weight or walk. You will need someone to be with you when you first try to ensure you do not fall and possibly risk injury.  4. Bruising and tenderness at the needle site are common side  effects and will resolve in a few days.  5. Persistent numbness or new problems with movement should be communicated to the surgeon or the [facility] [phone] [facility] [phone]). Regional Anesthesia Blocks  1. Numbness or the inability to move the "blocked" extremity may last from 3-48 hours after placement. The length of time depends on the medication injected and your individual response to the medication. If the numbness is not going away after 48 hours, call your surgeon.  2. The extremity that is blocked will need to be protected until the numbness is gone and the  Strength has returned. Because you cannot feel it, you will need to take extra care to avoid injury. Because it may be weak, you may have difficulty moving it or using it. You may not know what position it is in without looking at it while the block is in effect.  3. For blocks in the legs and feet, returning to weight bearing and walking needs to be done carefully. You will need to wait until the numbness is entirely gone and the strength has returned. You should be able to move your leg and foot normally before you try and bear weight or walk. You will need someone to be with you when you first try to ensure you do not fall and possibly risk injury.  4. Bruising and tenderness at the needle site are common side effects and will resolve in a few days.  5. Persistent numbness or new problems with movement should be communicated to the surgeon or the [facility] [phone] [facility] [phone]).Regional Anesthesia Blocks  1. Numbness or the inability to move the "blocked" extremity may last from 3-48 hours after placement. The length of time depends on the medication injected and your individual response to the medication. If the numbness is not going away after 48 hours, call your surgeon.  2. The extremity that is blocked will need to be protected until the  numbness is gone and the  Strength has returned. Because you cannot feel it, you will need to take extra care to avoid injury. Because it may be weak, you may have difficulty moving it or using it. You may not know what position it is in without looking at it while the block is in effect.  3. For blocks in the legs and feet, returning to weight bearing and walking needs to be done carefully. You will need to wait until the numbness is entirely gone and the strength has returned. You should be able to move your leg and foot normally before you try and bear weight or walk. You will need someone to be with you when you first try to ensure you do not fall and possibly risk injury.  4. Bruising and tenderness at the needle site are common side effects and will resolve in a few days.  5. Persistent numbness or new problems with movement should be  communicated to the surgeon or the [facility] [phone] [facility] [phone]).     Post Anesthesia Home Care Instructions  Activity: Get plenty of rest for the remainder of the day. A responsible individual must stay with you for 24 hours following the procedure.  For the next 24 hours, DO NOT: -Drive a car -Advertising copywriter -Drink alcoholic beverages -Take any medication unless instructed by your physician -Make any legal decisions or sign important papers.  Meals: Start with liquid foods such as gelatin or soup. Progress to regular foods as tolerated. Avoid greasy, spicy, heavy foods. If nausea and/or vomiting occur, drink only clear liquids until the nausea and/or vomiting subsides. Call your physician if vomiting continues.  Special Instructions/Symptoms: Your throat may feel dry or sore from the anesthesia or the breathing tube placed in your throat during surgery. If this causes discomfort, gargle with warm salt water. The discomfort should disappear within 24 hours.

## 2021-02-14 ENCOUNTER — Encounter (HOSPITAL_BASED_OUTPATIENT_CLINIC_OR_DEPARTMENT_OTHER): Payer: Self-pay | Admitting: Orthopedic Surgery

## 2022-09-28 ENCOUNTER — Other Ambulatory Visit (HOSPITAL_BASED_OUTPATIENT_CLINIC_OR_DEPARTMENT_OTHER): Payer: Self-pay

## 2022-09-28 MED ORDER — ZEPBOUND 2.5 MG/0.5ML ~~LOC~~ SOAJ
2.5000 mg | SUBCUTANEOUS | 0 refills | Status: DC
  Filled 2022-09-28: qty 2, 28d supply, fill #0

## 2022-10-23 ENCOUNTER — Encounter (HOSPITAL_BASED_OUTPATIENT_CLINIC_OR_DEPARTMENT_OTHER): Payer: Self-pay

## 2022-10-23 ENCOUNTER — Other Ambulatory Visit (HOSPITAL_BASED_OUTPATIENT_CLINIC_OR_DEPARTMENT_OTHER): Payer: Self-pay

## 2022-10-23 MED ORDER — MOUNJARO 5 MG/0.5ML ~~LOC~~ SOPN
5.0000 mg | PEN_INJECTOR | SUBCUTANEOUS | 0 refills | Status: AC
Start: 2022-10-23 — End: ?
  Filled 2022-10-23: qty 2, 28d supply, fill #0

## 2022-10-25 ENCOUNTER — Other Ambulatory Visit (HOSPITAL_BASED_OUTPATIENT_CLINIC_OR_DEPARTMENT_OTHER): Payer: Self-pay

## 2022-10-27 ENCOUNTER — Other Ambulatory Visit (HOSPITAL_BASED_OUTPATIENT_CLINIC_OR_DEPARTMENT_OTHER): Payer: Self-pay

## 2022-10-28 ENCOUNTER — Other Ambulatory Visit (HOSPITAL_BASED_OUTPATIENT_CLINIC_OR_DEPARTMENT_OTHER): Payer: Self-pay

## 2022-10-28 MED ORDER — MOUNJARO 7.5 MG/0.5ML ~~LOC~~ SOAJ
7.5000 mg | SUBCUTANEOUS | 0 refills | Status: AC
  Filled 2022-10-28: qty 2, 28d supply, fill #0

## 2022-10-30 ENCOUNTER — Other Ambulatory Visit (HOSPITAL_BASED_OUTPATIENT_CLINIC_OR_DEPARTMENT_OTHER): Payer: Self-pay

## 2022-10-31 ENCOUNTER — Other Ambulatory Visit (HOSPITAL_BASED_OUTPATIENT_CLINIC_OR_DEPARTMENT_OTHER): Payer: Self-pay

## 2022-10-31 MED ORDER — ZEPBOUND 7.5 MG/0.5ML ~~LOC~~ SOAJ
7.5000 mg | SUBCUTANEOUS | 0 refills | Status: DC
  Filled 2022-10-31: qty 2, 28d supply, fill #0

## 2022-11-03 ENCOUNTER — Other Ambulatory Visit (HOSPITAL_BASED_OUTPATIENT_CLINIC_OR_DEPARTMENT_OTHER): Payer: Self-pay

## 2022-11-05 ENCOUNTER — Other Ambulatory Visit (HOSPITAL_BASED_OUTPATIENT_CLINIC_OR_DEPARTMENT_OTHER): Payer: Self-pay

## 2022-11-30 ENCOUNTER — Other Ambulatory Visit (HOSPITAL_BASED_OUTPATIENT_CLINIC_OR_DEPARTMENT_OTHER): Payer: Self-pay

## 2022-11-30 MED ORDER — ZEPBOUND 10 MG/0.5ML ~~LOC~~ SOAJ
10.0000 mg | SUBCUTANEOUS | 0 refills | Status: DC
  Filled 2022-11-30: qty 2, 28d supply, fill #0

## 2022-12-02 ENCOUNTER — Other Ambulatory Visit (HOSPITAL_BASED_OUTPATIENT_CLINIC_OR_DEPARTMENT_OTHER): Payer: Self-pay

## 2022-12-30 ENCOUNTER — Other Ambulatory Visit (HOSPITAL_BASED_OUTPATIENT_CLINIC_OR_DEPARTMENT_OTHER): Payer: Self-pay

## 2022-12-31 ENCOUNTER — Other Ambulatory Visit (HOSPITAL_BASED_OUTPATIENT_CLINIC_OR_DEPARTMENT_OTHER): Payer: Self-pay

## 2022-12-31 MED ORDER — ZEPBOUND 10 MG/0.5ML ~~LOC~~ SOAJ
10.0000 mg | SUBCUTANEOUS | 5 refills | Status: AC
  Filled 2022-12-31: qty 2, 28d supply, fill #0
  Filled 2023-01-29 (×2): qty 2, 28d supply, fill #1

## 2023-01-01 ENCOUNTER — Other Ambulatory Visit (HOSPITAL_COMMUNITY): Payer: Self-pay

## 2023-01-29 ENCOUNTER — Other Ambulatory Visit (HOSPITAL_BASED_OUTPATIENT_CLINIC_OR_DEPARTMENT_OTHER): Payer: Self-pay

## 2023-02-26 ENCOUNTER — Other Ambulatory Visit (HOSPITAL_BASED_OUTPATIENT_CLINIC_OR_DEPARTMENT_OTHER): Payer: Self-pay

## 2023-02-26 MED ORDER — ZEPBOUND 10 MG/0.5ML ~~LOC~~ SOAJ
10.0000 mg | SUBCUTANEOUS | 0 refills | Status: AC
  Filled 2023-02-26: qty 2, 28d supply, fill #0

## 2023-03-01 ENCOUNTER — Other Ambulatory Visit (HOSPITAL_BASED_OUTPATIENT_CLINIC_OR_DEPARTMENT_OTHER): Payer: Self-pay

## 2023-04-08 ENCOUNTER — Other Ambulatory Visit (HOSPITAL_BASED_OUTPATIENT_CLINIC_OR_DEPARTMENT_OTHER): Payer: Self-pay

## 2023-04-08 MED ORDER — ZEPBOUND 12.5 MG/0.5ML ~~LOC~~ SOAJ: 12.5000 mg | SUBCUTANEOUS | 0 refills | Status: AC

## 2023-05-06 ENCOUNTER — Other Ambulatory Visit (HOSPITAL_BASED_OUTPATIENT_CLINIC_OR_DEPARTMENT_OTHER): Payer: Self-pay

## 2023-05-06 MED ORDER — ZEPBOUND 15 MG/0.5ML ~~LOC~~ SOAJ
15.0000 mg | SUBCUTANEOUS | 0 refills | Status: AC
Start: 2023-05-06 — End: ?
  Filled 2023-05-06: qty 2, 28d supply, fill #0
  Filled 2023-06-10: qty 2, 28d supply, fill #1
  Filled 2023-09-20: qty 2, 28d supply, fill #2

## 2023-05-21 ENCOUNTER — Other Ambulatory Visit (HOSPITAL_BASED_OUTPATIENT_CLINIC_OR_DEPARTMENT_OTHER): Payer: Self-pay

## 2023-06-10 ENCOUNTER — Other Ambulatory Visit (HOSPITAL_BASED_OUTPATIENT_CLINIC_OR_DEPARTMENT_OTHER): Payer: Self-pay

## 2023-07-14 ENCOUNTER — Other Ambulatory Visit (HOSPITAL_BASED_OUTPATIENT_CLINIC_OR_DEPARTMENT_OTHER): Payer: Self-pay

## 2023-07-14 MED ORDER — ZEPBOUND 15 MG/0.5ML ~~LOC~~ SOAJ
15.0000 mg | SUBCUTANEOUS | 3 refills | Status: AC
  Filled 2023-07-14: qty 2, 28d supply, fill #0
  Filled 2023-11-05: qty 2, 28d supply, fill #1
  Filled 2024-01-05: qty 2, 28d supply, fill #2
  Filled 2024-02-28: qty 2, 28d supply, fill #3

## 2023-09-20 ENCOUNTER — Other Ambulatory Visit (HOSPITAL_BASED_OUTPATIENT_CLINIC_OR_DEPARTMENT_OTHER): Payer: Self-pay

## 2023-11-05 ENCOUNTER — Other Ambulatory Visit (HOSPITAL_BASED_OUTPATIENT_CLINIC_OR_DEPARTMENT_OTHER): Payer: Self-pay

## 2024-01-05 ENCOUNTER — Other Ambulatory Visit (HOSPITAL_BASED_OUTPATIENT_CLINIC_OR_DEPARTMENT_OTHER): Payer: Self-pay

## 2024-02-28 ENCOUNTER — Other Ambulatory Visit (HOSPITAL_BASED_OUTPATIENT_CLINIC_OR_DEPARTMENT_OTHER): Payer: Self-pay

## 2024-02-28 MED ORDER — FLUZONE 0.5 ML IM SUSY
0.5000 mL | PREFILLED_SYRINGE | Freq: Once | INTRAMUSCULAR | 0 refills | Status: AC
  Filled 2024-02-28: qty 0.5, 1d supply, fill #0
